# Patient Record
Sex: Male | Born: 2000 | Race: White | Hispanic: Yes | Smoking: Never smoker
Health system: Southern US, Community
[De-identification: ages and names within clinical notes are randomized; demographics above are authoritative.]

## PROBLEM LIST (undated history)

## (undated) DIAGNOSIS — F419 Anxiety disorder, unspecified: Secondary | ICD-10-CM

## (undated) DIAGNOSIS — Z973 Presence of spectacles and contact lenses: Secondary | ICD-10-CM

## (undated) DIAGNOSIS — R32 Unspecified urinary incontinence: Secondary | ICD-10-CM

## (undated) DIAGNOSIS — J45909 Unspecified asthma, uncomplicated: Secondary | ICD-10-CM

## (undated) HISTORY — DX: Anxiety disorder, unspecified: F41.9

## (undated) HISTORY — PX: ADENOIDECTOMY: SUR15

## (undated) HISTORY — DX: Presence of spectacles and contact lenses: Z97.3

## (undated) HISTORY — DX: Unspecified urinary incontinence: R32

## (undated) HISTORY — DX: Unspecified asthma, uncomplicated: J45.909

---
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
## 2020-07-12 DIAGNOSIS — K219 Gastro-esophageal reflux disease without esophagitis: Secondary | ICD-10-CM | POA: Diagnosis not present

## 2020-09-08 ENCOUNTER — Other Ambulatory Visit: Payer: Self-pay

## 2020-09-08 ENCOUNTER — Encounter: Payer: Self-pay | Admitting: Medical

## 2020-09-08 ENCOUNTER — Ambulatory Visit: Payer: 59 | Admitting: Medical

## 2020-09-08 VITALS — BP 124/82 | HR 60 | Temp 98.2°F | Wt 259.2 lb

## 2020-09-08 DIAGNOSIS — Z7185 Encounter for immunization safety counseling: Secondary | ICD-10-CM

## 2020-09-08 DIAGNOSIS — Z13220 Encounter for screening for lipoid disorders: Secondary | ICD-10-CM

## 2020-09-08 DIAGNOSIS — R002 Palpitations: Secondary | ICD-10-CM | POA: Diagnosis not present

## 2020-09-08 DIAGNOSIS — Z0000 Encounter for general adult medical examination without abnormal findings: Secondary | ICD-10-CM

## 2020-09-08 DIAGNOSIS — R42 Dizziness and giddiness: Secondary | ICD-10-CM | POA: Insufficient documentation

## 2020-09-08 DIAGNOSIS — Z131 Encounter for screening for diabetes mellitus: Secondary | ICD-10-CM

## 2020-09-08 DIAGNOSIS — Z113 Encounter for screening for infections with a predominantly sexual mode of transmission: Secondary | ICD-10-CM

## 2020-09-08 DIAGNOSIS — R109 Unspecified abdominal pain: Secondary | ICD-10-CM | POA: Insufficient documentation

## 2020-09-08 LAB — LIPID PANEL

## 2020-09-08 LAB — CBC WITH DIFFERENTIAL/PLATELET
Eos: 10 %
Immature Granulocytes: 0 %
MCHC: 33.1 g/dL (ref 31.5–35.7)
MCV: 88 fL (ref 79–97)
RDW: 11.9 % (ref 11.6–15.4)

## 2020-09-08 LAB — COMPREHENSIVE METABOLIC PANEL

## 2020-09-08 LAB — RPR

## 2020-09-08 LAB — HIV ANTIBODY (ROUTINE TESTING W REFLEX)

## 2020-09-08 NOTE — Progress Notes (Signed)
Subjective:   HPI  Ricky Morgan is a 19 y.o. male who presents for Chief Complaint  Patient presents with  . other    new pt. acute BP issues uncomfortable when standing at work head feels heavy gets light headed after sitting sometimes and getting up to fast.     Patient Care Team: Andrus Sharp, Cleda Mccreedy as PCP - General (Family Medicine) Sees dentist Sees eye doctor  Concerns: Here as a new patient.   Works for [employer]. Feels off.  When standing for long periods talking with a customer, feels heavy or unusual standing in one place for a period of time.  Back of legs can get achy.  Sometimes feels lightheaded.  Particularly if standing fast or moving suddenly feels lightheaded.   Sometimes particularly in summer would feel dehydrated.   Sometimes can get a little urinary leakage, particular wit urinary frequency.    Nonsmoker.  Drinks maybe once a month.    Has had some anxiety issues.  Earlier in the [place], was nervous on the plane.  Felt uneasy in a group of people when out of state earlier this year.     When eating pasta, feels awful aft wards.   He has recently cut back on this which has  Been helpful.  Been using kambucha  Was 290lb end of 2020 but since working 2 jobs earlier this year, lost some weight.  Heaviest weight was around 300lb.    Reviewed their medical, surgical, family, social, medication, and allergy history and updated chart as appropriate.  Past Medical History:  Diagnosis Date  . Anxiety   . Urinary incontinence    summer 08/2020  . Wears glasses     Past Surgical History:  Procedure Laterality Date  . ADENOIDECTOMY      Family History  Problem Relation Age of Onset  . Hypertension Father   . Hyperlipidemia Father   . Diabetes Father        prediabetes  . Cancer Neg Hx   . Heart disease Neg Hx     No current outpatient medications on file.  Not on File     Review of Systems Constitutional: -fever, -chills,  -sweats, -unexpected weight change, -decreased appetite, -fatigue Allergy: -sneezing, -itching, -congestion Dermatology: -changing moles, --rash, -lumps ENT: -runny nose, -ear pain, -sore throat, -hoarseness, -sinus pain, -teeth pain, - ringing in ears, -hearing loss, -nosebleeds Cardiology: -chest pain, +palpitations, -swelling, -difficulty breathing when lying flat, -waking up short of breath Respiratory: -cough, -shortness of breath, -difficulty breathing with exercise or exertion, -wheezing, -coughing up blood Gastroenterology: +abdominal pain, -nausea, -vomiting, -diarrhea, -constipation, -blood in stool, -changes in bowel movement, -difficulty swallowing or eating Hematology: -bleeding, -bruising  Musculoskeletal: -joint aches, -muscle aches, -joint swelling, -back pain, -neck pain, -cramping, -changes in gait Ophthalmology: denies vision changes, eye redness, itching, discharge Urology: -burning with urination, -difficulty urinating, -blood in urine, -urinary frequency, -urgency, -incontinence Neurology: -headache, -weakness, -tingling, -numbness, -memory loss, -falls, +dizziness Psychology: -depressed mood, -agitation, -sleep problems Male GU: no testicular mass, pain, no lymph nodes swollen, no swelling, no rash.     Objective:  BP 124/82   Pulse 60   Temp 98.2 F (36.8 C)   Wt 259 lb 3.2 oz (117.6 kg)   General appearance: alert, no distress, WD/WN, Caucasian male Skin: unremarkable HEENT: normocephalic, conjunctiva/corneas normal, sclerae anicteric, PERRLA, EOMi, nares patent, no discharge or erythema, pharynx normal Oral cavity: MMM, tongue normal, teeth normal Neck: carotid pulsation noted in right  neck, supple, no lymphadenopathy, no thyromegaly, no masses, normal ROM, no bruits Chest: non tender, normal shape and expansion Heart: RRR, normal S1, S2, no murmurs Lungs: CTA bilaterally, no wheezes, rhonchi, or rales Abdomen: +bs, soft, non tender, non distended, no masses,  no hepatomegaly, no splenomegaly, no bruits Back: non tender, normal ROM, no scoliosis Musculoskeletal: upper extremities non tender, no obvious deformity, normal ROM throughout, lower extremities non tender, no obvious deformity, normal ROM throughout Extremities: no edema, no cyanosis, no clubbing Pulses: 2+ symmetric, upper and lower extremities, normal cap refill Neurological: alert, oriented x 3, CN2-12 intact, strength normal upper extremities and lower extremities, sensation normal throughout, DTRs 2+ throughout, no cerebellar signs, gait normal Psychiatric: normal affect, behavior normal, pleasant  GU: normal male external genitalia,uncircumcised, nontender, no masses, no hernia, no lymphadenopathy Rectal: deferred   Assessment and Plan :   Encounter Diagnoses  Name Primary?  . Encounter for health maintenance examination in adult Yes  . Lightheaded   . Dizziness   . Vaccine counseling   . Screen for STD (sexually transmitted disease)   . Palpitation   . Abdominal discomfort   . Screening for lipid disorders   . Screening for diabetes mellitus     Physical exam - discussed and counseled on healthy lifestyle, diet, exercise, preventative care, vaccinations, sick and well care, proper use of emergency dept and after hours care, and addressed their concerns.    Health screening: See your eye doctor yearly for routine vision care. See your dentist yearly for routine dental care including hygiene visits twice yearly.  Discussed STD testing, discussed prevention, condom use, means of transmission  Cancer screening Advised monthly self testicular exam  Vaccinations: Advised yearly influenza vaccine Declines vaccines today   Separate significant issues discussed: We discussed his concerns.  It is not clear if this is a psychological issue versus something medical.  We will do screening today to help rule out some things.  He has lost significant weight since a year ago.  He  was close to 300 pounds a year ago.  A lot of his symptoms in the summertime could have been dehydration related.  Screenings today, labs as below.  He may have some gluten issues given that he has belly issues eating pasta and similar foods.  I advised if no lab abnormalities and if the EKG is normal then there could be some component of anxiety.  Counseled on good water intake, minimizing salt and caffeine.  Amado was seen today for other.  Diagnoses and all orders for this visit:  Encounter for health maintenance examination in adult -     EKG 12-Lead -     Comprehensive metabolic panel -     CBC with Differential/Platelet -     TSH -     Lipid panel -     HIV Antibody (routine testing w rflx) -     RPR -     GC/Chlamydia Probe Amp -     Hemoglobin A1c  Lightheaded -     EKG 12-Lead  Dizziness -     EKG 12-Lead  Vaccine counseling  Screen for STD (sexually transmitted disease) -     HIV Antibody (routine testing w rflx) -     RPR -     GC/Chlamydia Probe Amp  Palpitation -     EKG 12-Lead  Abdominal discomfort -     Lipid panel  Screening for lipid disorders  Screening for diabetes mellitus -  Hemoglobin A1c    Follow-up pending labs, yearly for physical

## 2020-09-09 LAB — TSH: TSH: 1.66 u[IU]/mL (ref 0.450–4.500)

## 2020-09-09 LAB — COMPREHENSIVE METABOLIC PANEL
ALT: 23 IU/L (ref 0–44)
AST: 21 IU/L (ref 0–40)
Albumin/Globulin Ratio: 1.8 (ref 1.2–2.2)
Albumin: 4.8 g/dL (ref 4.1–5.2)
Alkaline Phosphatase: 79 IU/L (ref 44–121)
BUN: 12 mg/dL (ref 6–20)
Bilirubin Total: 0.3 mg/dL (ref 0.0–1.2)
CO2: 22 mmol/L (ref 20–29)
Calcium: 9.5 mg/dL (ref 8.7–10.2)
GFR calc Af Amer: 135 mL/min/{1.73_m2} (ref >59)
GFR calc non Af Amer: 117 mL/min/{1.73_m2} (ref >59)
Globulin, Total: 2.7 g/dL (ref 1.5–4.5)
Glucose: 88 mg/dL (ref 65–99)
Potassium: 4.4 mmol/L (ref 3.5–5.2)
Total Protein: 7.5 g/dL (ref 6.0–8.5)

## 2020-09-09 LAB — LIPID PANEL
Cholesterol, Total: 177 mg/dL (ref 100–199)
HDL: 51 mg/dL (ref >39)
VLDL Cholesterol Cal: 29 mg/dL (ref 5–40)

## 2020-09-09 LAB — CBC WITH DIFFERENTIAL/PLATELET
Basophils Absolute: 0 10*3/uL (ref 0.0–0.2)
Basos: 1 %
EOS (ABSOLUTE): 0.6 10*3/uL — ABNORMAL HIGH (ref 0.0–0.4)
Hematocrit: 45 % (ref 37.5–51.0)
Hemoglobin: 14.9 g/dL (ref 13.0–17.7)
Immature Grans (Abs): 0 10*3/uL (ref 0.0–0.1)
Lymphocytes Absolute: 1.9 10*3/uL (ref 0.7–3.1)
Lymphs: 34 %
MCH: 29.1 pg (ref 26.6–33.0)
Monocytes Absolute: 0.4 10*3/uL (ref 0.1–0.9)
Monocytes: 8 %
Neutrophils Absolute: 2.7 10*3/uL (ref 1.4–7.0)
Neutrophils: 47 %
Platelets: 313 10*3/uL (ref 150–450)
RBC: 5.12 x10E6/uL (ref 4.14–5.80)
WBC: 5.7 10*3/uL (ref 3.4–10.8)

## 2020-09-09 LAB — HEMOGLOBIN A1C
Est. average glucose Bld gHb Est-mCnc: 105 mg/dL
Hgb A1c MFr Bld: 5.3 % (ref 4.8–5.6)

## 2020-09-10 LAB — GC/CHLAMYDIA PROBE AMP
Chlamydia trachomatis, NAA: NEGATIVE
Neisseria Gonorrhoeae by PCR: NEGATIVE

## 2020-10-27 ENCOUNTER — Other Ambulatory Visit (INDEPENDENT_AMBULATORY_CARE_PROVIDER_SITE_OTHER): Payer: 59

## 2020-10-27 ENCOUNTER — Encounter: Payer: Self-pay | Admitting: Medical

## 2020-10-27 ENCOUNTER — Other Ambulatory Visit: Payer: Self-pay

## 2020-10-27 ENCOUNTER — Telehealth: Payer: 59 | Admitting: Medical

## 2020-10-27 VITALS — Temp 97.4°F | Ht 74.0 in | Wt 260.0 lb

## 2020-10-27 DIAGNOSIS — Z20822 Contact with and (suspected) exposure to covid-19: Secondary | ICD-10-CM

## 2020-10-27 DIAGNOSIS — R0981 Nasal congestion: Secondary | ICD-10-CM

## 2020-10-27 DIAGNOSIS — R109 Unspecified abdominal pain: Secondary | ICD-10-CM | POA: Diagnosis not present

## 2020-10-27 DIAGNOSIS — R140 Abdominal distension (gaseous): Secondary | ICD-10-CM

## 2020-10-27 DIAGNOSIS — J029 Acute pharyngitis, unspecified: Secondary | ICD-10-CM

## 2020-10-27 DIAGNOSIS — K5900 Constipation, unspecified: Secondary | ICD-10-CM

## 2020-10-27 LAB — POC COVID19 BINAXNOW: SARS Coronavirus 2 Ag: NEGATIVE

## 2020-10-27 MED ORDER — OMEPRAZOLE 40 MG PO CPDR: 40.0000 mg | DELAYED_RELEASE_CAPSULE | Freq: Every day | ORAL | 0 refills | Status: DC

## 2020-10-27 MED ORDER — POLYETHYLENE GLYCOL 3350 17 GM/SCOOP PO POWD: 17.0000 g | Freq: Every day | ORAL | 0 refills | Status: DC

## 2020-10-27 NOTE — Progress Notes (Signed)
Subjective:     Patient ID: Ricky Morgan, male   DOB: June 07, 2001, 20 y.o.   MRN: [id number]  This visit type was conducted due to national recommendations for restrictions regarding the COVID-19 Pandemic (e.g. social distancing) in an effort to limit this patient's exposure and mitigate transmission in our community.  Due to their co-morbid illnesses, this patient is at least at moderate risk for complications without adequate follow up.  This format is felt to be most appropriate for this patient at this time.    Documentation for virtual audio and video telecommunications through [place] encounter:  The patient was located at home. The provider was located in the office. The patient did consent to this visit and is aware of possible charges through their insurance for this visit.  The other persons participating in this telemedicine service were none. Time spent on call was 20 minutes and in review of previous records 20 minutes total.  This virtual service is not related to other E/M service within previous 7 days.   HPI Chief Complaint  Patient presents with  . Covid Exposure    Gf has covid-scratchy throat started last Friday and is getting better   Ricky Morgan for exposure.     Has had about a week of symptoms including headache, sore throat, some fatigue last weekend, but otherwise fine.   Most symptoms have resolved, so now just a lingering scratchy throat.     currently no fever, no signifnicant cough, no vomiting, no head congestion.   He has not been tested.  He thought he just had a cold.     His girlfriend has a little congestion and sore throat, got tested and showed up positive for covid yesterday.   He and girlfriend lives together and works at the same place.  He has not had the covid vaccine.    He notes second issue.   Gets bloated after eating certain foods.  lately getting some constipation.  Trying to cut out carbs.  Sometimes gets random abdominal pains  . Sometimes upper right, sometimes all over.   Sometimes gets pain within 30 minutes of eating.  Has tried some digestive enzymes that helps some times.  Heavier foods such as cheese and dairy seems to make things worse.  At times belching.  Feels like a lump in throat after eating.  Having BM about once daily, but lately small harder stool.  No blood in stool.  No other aggravating or relieving factors. No other complaint.   Past Medical History:  Diagnosis Date  . Anxiety   . Urinary incontinence    summer 08/2020  . Wears glasses    No current outpatient medications on file prior to visit.   No current facility-administered medications on file prior to visit.     Review of Systems As in subjective    Objective:   Physical Exam Due to coronavirus pandemic stay at home measures, patient visit was virtual and they were not examined in person.   Temp (!) 97.4 F (36.3 C)   Ht 6\' 2"  (1.88 m)   Wt 260 lb (117.9 kg)   BMI 33.38 kg/m   Gen: wd, wn, nad No obvious wheezing or dyspnea Well appearing      Assessment:     Encounter Diagnoses  Name Primary?  . Sore throat Yes  . Close exposure to COVID-19 virus   . Head congestion   . Abdominal pain, unspecified abdominal location   . Abdominal bloating   .  Constipation, unspecified constipation type        Plan:     COVID exposure, a week of symptoms last week with mild cold symptoms that is mostly resolved.  He will come in today for COVID testing for work purposes.  If negative he states that his work will require him to come back in next week on January 11 for a repeat COVID test to make sure he is negative.  Either way he wants to come in today for screening  We discussed supportive care, rest, hydrate well, Tylenol as needed for pain or aches or fever, over-the-counter cold and flu medicine if needed but his symptoms have mostly resolved  We discussed quarantine measures  Abdominal pain, constipation, bloating-we  discussed differential which could include indigestion, reflux, constipation or even gallbladder issue.  Advised he avoid acidic and spicy foods, avoid fatty foods and fried foods and big portions.  Begin medication as below MiraLAX and omeprazole.  If much improved within 2 weeks then let me know.  If not improving or if worse right upper quadrant pain come in person for evaluation for exam and possible imaging such as KUB and/or ultrasound of abdomen  Ricky Morgan was seen today for covid exposure.  Diagnoses and all orders for this visit:  Sore throat -     POC COVID-19 BinaxNow; Future -     Novel Coronavirus, NAA ([facility]); Future  Close exposure to COVID-19 virus -     POC COVID-19 BinaxNow; Future -     Novel Coronavirus, NAA ([facility]); Future  Head congestion -     POC COVID-19 BinaxNow; Future -     Novel Coronavirus, NAA ([facility]); Future  Abdominal pain, unspecified abdominal location  Abdominal bloating  Constipation, unspecified constipation type  Other orders -     omeprazole (PRILOSEC) 40 MG capsule; Take 1 capsule (40 mg total) by mouth daily. -     polyethylene glycol powder (GLYCOLAX/MIRALAX) 17 GM/SCOOP powder; Take 17 g by mouth daily.  f/u today for covid testing

## 2020-10-30 ENCOUNTER — Telehealth: Payer: Self-pay

## 2020-10-30 NOTE — Telephone Encounter (Signed)
Pt. Aware I will put in the order tomorrow once checked in.

## 2020-10-30 NOTE — Telephone Encounter (Signed)
Pt. Called stating that you told him last week when he had his virtual apt with you he could come back Tuesday for a rapid covid test only to return to work. I scheduled him for tomorrow morning at 10:30 a.m. is this ok.

## 2020-10-30 NOTE — Telephone Encounter (Signed)
Yes that is fine for him to come back in for the rapid.  Could you please put in the order?  Also check with Byrd Hesselbach because the PCR test is not back yet from 10/27/2020

## 2020-10-31 ENCOUNTER — Other Ambulatory Visit: Payer: Self-pay

## 2020-10-31 ENCOUNTER — Other Ambulatory Visit (INDEPENDENT_AMBULATORY_CARE_PROVIDER_SITE_OTHER): Payer: 59

## 2020-10-31 DIAGNOSIS — R059 Cough, unspecified: Secondary | ICD-10-CM | POA: Diagnosis not present

## 2020-10-31 LAB — NOVEL CORONAVIRUS, NAA: SARS-CoV-2, NAA: NOT DETECTED

## 2020-10-31 LAB — POC COVID19 BINAXNOW: SARS Coronavirus 2 Ag: NEGATIVE

## 2020-11-19 ENCOUNTER — Other Ambulatory Visit: Payer: Self-pay | Admitting: Medical

## 2020-11-29 ENCOUNTER — Other Ambulatory Visit: Payer: Self-pay

## 2020-11-29 ENCOUNTER — Ambulatory Visit: Payer: 59 | Admitting: Medical

## 2020-11-29 ENCOUNTER — Encounter: Payer: Self-pay | Admitting: Medical

## 2020-11-29 VITALS — BP 136/70 | HR 71 | Temp 98.2°F | Ht 74.0 in | Wt 262.8 lb

## 2020-11-29 DIAGNOSIS — K5900 Constipation, unspecified: Secondary | ICD-10-CM | POA: Diagnosis not present

## 2020-11-29 DIAGNOSIS — R1011 Right upper quadrant pain: Secondary | ICD-10-CM | POA: Insufficient documentation

## 2020-11-29 DIAGNOSIS — K219 Gastro-esophageal reflux disease without esophagitis: Secondary | ICD-10-CM

## 2020-11-29 DIAGNOSIS — R0789 Other chest pain: Secondary | ICD-10-CM

## 2020-11-29 MED ORDER — DOCUSATE SODIUM 100 MG PO CAPS: 100.0000 mg | ORAL_CAPSULE | Freq: Two times a day (BID) | ORAL | 0 refills | Status: DC

## 2020-11-29 NOTE — Progress Notes (Signed)
Done

## 2020-11-29 NOTE — Progress Notes (Signed)
Subjective:  Ricky Morgan is a 20 y.o. male who presents for Chief Complaint  Patient presents with  . Flank Pain    Right side pain x4 days      Here for pains.  Just got back from a trip.  Went to [employer] recently with some friends.   Did some amusement rides.  Did the alpine coaster.  Ate some unhealthy foods while there.  Drank a fair amount of alcohol while there.  No specific trauma or injury.  No obvious bruising.  Has some aching pain in right upper abdomen area.  Has subsided a little but still an annoying pain.  At times he may get some pain after eating but not every time.  No nausea no vomiting.  Lately he has been a little constipated at times.  He still can be a little harder and may not go necessarily every day.  The color even looks different lately.   Did some milk of magnesium recently.  It did help   Last visit virtual he also had ongoing RUQ pains.  Past Medical History:  Diagnosis Date  . Anxiety   . Urinary incontinence    summer 08/2020  . Wears glasses    No other aggravating or relieving factors. No other complaint.   The following portions of the patient's history were reviewed and updated as appropriate: allergies, current medications, past family history, past medical history, past social history, past surgical history and problem list.  ROS Otherwise as in subjective above  Objective: BP 136/70   Pulse 71   Temp 98.2 F (36.8 C)   Ht 6\' 2"  (1.88 m)   Wt 262 lb 12.8 oz (119.2 kg)   SpO2 98%   BMI 33.74 kg/m   General appearance: alert, no distress, well developed, well nourished Heart: RRR, normal S1, S2, no murmurs Lungs: CTA bilaterally, no wheezes, rhonchi, or rales Tender right anterior lower chest wall over the ribs but no bruising, otherwise chest nontender, normal inspiration expiration Abdomen: +bs, soft, mild right upper quadrant tenderness, non tender, non distended, no masses, no hepatomegaly, no splenomegaly Pulses: 2+ radial  pulses, 2+ pedal pulses, normal cap refill Ext: no edema   Assessment: Encounter Diagnoses  Name Primary?  . Chest wall pain Yes  . RUQ abdominal pain   . Gastroesophageal reflux disease, unspecified whether esophagitis present   . Constipation, unspecified constipation type      Plan: We discussed his recent concerns.  He does seem to have some chest wall tenderness likely from being bumped around on the [place] this past weekend.  Advise short-term relative rest, avoid re injury, can use cool pack or ice pack 20 minutes on 20 minutes off, over-the-counter Tylenol or ibuprofen for the next several days.  This should gradually resolve  Right upper quadrant pain-ongoing for at least the last few months.  Possibility of gallbladder issue discussed.  We will schedule for ultrasound.  Avoid acidic foods, spicy foods, fatty foods, large portions  Constipation-begin Colace as needed, discussed good hydration, fiber intake  GERD-continue PPI for now  Ricky Morgan was seen today for flank pain.  Diagnoses and all orders for this visit:  Chest wall pain  RUQ abdominal pain -     Ricky Morgan Abdomen Complete; Future  Gastroesophageal reflux disease, unspecified whether esophagitis present  Constipation, unspecified constipation type  Other orders -     docusate sodium (COLACE) 100 MG capsule; Take 1 capsule (100 mg total) by mouth 2 (two)  times daily.    Follow up: Pending ultrasound

## 2020-12-13 ENCOUNTER — Other Ambulatory Visit: Payer: Self-pay

## 2020-12-21 ENCOUNTER — Other Ambulatory Visit: Payer: Self-pay | Admitting: Medical

## 2020-12-26 ENCOUNTER — Ambulatory Visit
Admission: RE | Admit: 2020-12-26 | Discharge: 2020-12-26 | Disposition: A | Discharge status: Home / Self Care | Payer: Managed Care, Other (non HMO) | Source: Ambulatory Visit | Attending: Medical | Admitting: Medical

## 2020-12-26 DIAGNOSIS — R1011 Right upper quadrant pain: Secondary | ICD-10-CM

## 2021-01-01 ENCOUNTER — Other Ambulatory Visit: Payer: Self-pay

## 2021-01-01 DIAGNOSIS — R1011 Right upper quadrant pain: Secondary | ICD-10-CM

## 2021-01-01 DIAGNOSIS — K5900 Constipation, unspecified: Secondary | ICD-10-CM

## 2021-01-01 DIAGNOSIS — K219 Gastro-esophageal reflux disease without esophagitis: Secondary | ICD-10-CM

## 2021-01-06 ENCOUNTER — Other Ambulatory Visit: Payer: Self-pay | Admitting: Medical

## 2021-03-05 ENCOUNTER — Encounter: Payer: Self-pay | Admitting: Nurse Practitioner

## 2021-03-06 ENCOUNTER — Encounter: Payer: Self-pay | Admitting: Nurse Practitioner

## 2021-03-06 ENCOUNTER — Telehealth: Payer: Managed Care, Other (non HMO) | Admitting: Family

## 2021-03-06 DIAGNOSIS — R109 Unspecified abdominal pain: Secondary | ICD-10-CM

## 2021-03-07 NOTE — Progress Notes (Signed)
Based on what you shared with me, I feel your condition warrants further evaluation and I recommend that you be seen in a face to face office visit.  Given your abdominal pain, you need to be seen in person to be evaluated.    NOTE: If you entered your credit card information for this eVisit, you will not be charged. You may see a "hold" on your card for the $35 but that hold will drop off and you will not have a charge processed.   If you are having a true medical emergency please call 911.      For an urgent face to face visit,  has six urgent care centers for your convenience:     [facility] at [facility] Directions [phone] [address] [place], [place] [address] . 8 am - 4 pm Monday - Friday    [facility] Get Driving Directions [phone] [address] [place], [place] [address] . 8 am to 8 pm Monday-Friday . 10 am to 6 pm [facility] Urgent [facility]) Get Driving Directions [phone]  [address] [place],  [place]  [address] . 8 am to 8 pm Monday-Friday . 8 am to 4 pm [facility] at [facility] Get Driving Directions [phone] [address] [place], [place] [address] . 8 am to 8 pm Monday-Friday . 8 am to 4 pm [facility] at [facility] Get Driving Directions  [phone] [address] [place], [place] [address] . 8 am to 8 pm Monday-Friday . 8 am to 4 pm [facility] at [facility] Directions [phone] [address], [address] [place], [place] [address] . 8 am to 8 pm Monday-Friday . 8 am to 4 pm Saturday-Sunday     Your MyChart E-visit questionnaire answers were reviewed by a board certified advanced clinical practitioner to complete your personal care  plan based on your specific symptoms.  Thank you for using e-Visits.

## 2021-03-28 ENCOUNTER — Encounter: Payer: Self-pay | Admitting: Nurse Practitioner

## 2021-03-28 ENCOUNTER — Ambulatory Visit: Payer: Managed Care, Other (non HMO) | Admitting: Nurse Practitioner

## 2021-03-28 ENCOUNTER — Other Ambulatory Visit (INDEPENDENT_AMBULATORY_CARE_PROVIDER_SITE_OTHER): Payer: Managed Care, Other (non HMO)

## 2021-03-28 VITALS — BP 120/76 | HR 64 | Ht 72.5 in | Wt 300.2 lb

## 2021-03-28 DIAGNOSIS — R1012 Left upper quadrant pain: Secondary | ICD-10-CM

## 2021-03-28 DIAGNOSIS — K5900 Constipation, unspecified: Secondary | ICD-10-CM

## 2021-03-28 DIAGNOSIS — R1011 Right upper quadrant pain: Secondary | ICD-10-CM

## 2021-03-28 DIAGNOSIS — K824 Cholesterolosis of gallbladder: Secondary | ICD-10-CM

## 2021-03-28 LAB — CBC WITH DIFFERENTIAL/PLATELET
Basophils Absolute: 0 10*3/uL (ref 0.0–0.1)
Basophils Relative: 0.3 % (ref 0.0–3.0)
Eosinophils Absolute: 0.2 10*3/uL (ref 0.0–0.7)
Eosinophils Relative: 4.8 % (ref 0.0–5.0)
HCT: 41.8 % (ref 39.0–52.0)
Hemoglobin: 14.3 g/dL (ref 13.0–17.0)
Lymphocytes Relative: 41.2 % (ref 12.0–46.0)
Lymphs Abs: 1.9 10*3/uL (ref 0.7–4.0)
MCHC: 34.2 g/dL (ref 30.0–36.0)
MCV: 86.1 fl (ref 78.0–100.0)
Monocytes Absolute: 0.4 10*3/uL (ref 0.1–1.0)
Monocytes Relative: 8.5 % (ref 3.0–12.0)
Neutro Abs: 2.1 10*3/uL (ref 1.4–7.7)
Neutrophils Relative %: 45.2 % (ref 43.0–77.0)
Platelets: 280 10*3/uL (ref 150.0–400.0)
RBC: 4.86 Mil/uL (ref 4.22–5.81)
RDW: 12.7 % (ref 11.5–15.5)
WBC: 4.6 10*3/uL (ref 4.0–10.5)

## 2021-03-28 LAB — COMPREHENSIVE METABOLIC PANEL
ALT: 25 U/L (ref 0–53)
AST: 27 U/L (ref 0–37)
Albumin: 4.5 g/dL (ref 3.5–5.2)
Alkaline Phosphatase: 58 U/L (ref 39–117)
BUN: 15 mg/dL (ref 6–23)
CO2: 25 mEq/L (ref 19–32)
Calcium: 9.6 mg/dL (ref 8.4–10.5)
Chloride: 104 mEq/L (ref 96–112)
Creatinine, Ser: 0.92 mg/dL (ref 0.40–1.50)
GFR: 118.21 mL/min (ref >60.00)
Glucose, Bld: 89 mg/dL (ref 70–99)
Potassium: 4.2 mEq/L (ref 3.5–5.1)
Sodium: 137 mEq/L (ref 135–145)
Total Bilirubin: 0.4 mg/dL (ref 0.2–1.2)
Total Protein: 7.7 g/dL (ref 6.0–8.3)

## 2021-03-28 NOTE — Progress Notes (Signed)
03/28/2021 Majid Mccravy [id number] 01/14/2001   CHIEF COMPLAINT: abdominal pain   HISTORY OF PRESENT ILLNESS:  Ricky Morgan is a 20 year old male with a past medical history of obesity, anxiety, allergies, asthma and constipation. Past tonsillectomy and adenoidectomy.  He was referred to our office by Crosby Oyster PA-C for further evaluation regarding GERD symptoms and constipation.  He complains of having intermittent right upper quadrant abdominal pain for the past 6 months and less frequent left upper quadrant abdominal pain.  He describes RUQ and LUQ pain as throbbing or squeezing type pain.  Eating food such as bread and Pasta worsen his abdominal pains.  He has mild nausea at times without vomiting.  He develops heartburn if he eats foods such as sausage or if he eats a larger meal.  No dysphagia.  He started taking a digestive enzyme daily 2 or 3 months ago and drinking kombucha and his upper abdominal pains have diminished.  He took Omeprazole for 1 week in March 2022 which resulted in feeling full with worsening constipation so he stopped taking it.  He typically passes a small brown formed bowel movement most days.  He does not feel emptied after defecation.  No rectal bleeding or melena.  He underwent a RUQ sonogram 12/26/2020 which showed 2 small gallbladder polyps without evidence of gallstones and the CBD was normal at 3 mm.  Possible mild fatty steatosis was noted.  He went on a keto diet for a few months and lost about 20 pounds then resumed a normal diet and gained back the lost weight.  He takes Aleve 2 tabs once monthly for generalized aches and pains.  He drinks 6-7 shots of alcohol once monthly.  No drug use.  His father had gallbladder surgery when he was 20 years old.  No known family history of celiac disease, IBD, esophageal or GI related cancer.   CBC Latest Ref Rng & Units 09/08/2020  WBC 3.4 - 10.8 x10E3/uL 5.7  Hemoglobin 13.0 - 17.7 g/dL 41.9  Hematocrit 62.2 - 51.0 %  45.0  Platelets 150 - 450 x10E3/uL 313   CMP Latest Ref Rng & Units 09/08/2020  Glucose 65 - 99 mg/dL 88  BUN 6 - 20 mg/dL 12  Creatinine 2.97 - 9.89 mg/dL 2.11  Sodium 941 - 740 mmol/L 138  Potassium 3.5 - 5.2 mmol/L 4.4  Chloride 96 - 106 mmol/L 103  CO2 20 - 29 mmol/L 22  Calcium 8.7 - 10.2 mg/dL 9.5  Total Protein 6.0 - 8.5 g/dL 7.5  Total Bilirubin 0.0 - 1.2 mg/dL 0.3  Alkaline Phos 44 - 121 IU/L 79  AST 0 - 40 IU/L 21  ALT 0 - 44 IU/L 23   RUQ sonogram 12/26/2020:  FINDINGS: Gallbladder: 2 small gallbladder polyps, the largest 6 mm. No stone disease. No wall thickening. No Murphy sign.  Common bile duct: Diameter: 3 mm common normal  Liver: Heterogeneous echotexture suggesting possible mild fatty change. No focal lesion. No ductal dilatation. Portal vein is patent on color Doppler imaging with normal direction of blood flow towards the liver.  IVC: No abnormality visualized.  Pancreas: Poorly seen because of overlying bowel gas.  Spleen: Size and appearance within normal limits.  Right Kidney: Length: 13.2 cm. Echogenicity within normal limits. No mass or hydronephrosis visualized.  Left Kidney: Length: 13.3 cm. Echogenicity within normal limits. No mass or hydronephrosis visualized.  Abdominal aorta: No aneurysm visualized.  Other findings: No ascites  IMPRESSION: Normal study  with exception of a slightly heterogeneous echotexture pattern of the liver parenchyma that could go along with mild fatty change. No gallbladder disease, focal liver parenchymal lesion or ductal dilatation.  Pancreas poorly seen because of overlying bowel gas.   Past Medical History:  Diagnosis Date  . Anxiety   . Urinary incontinence    summer 08/2020  . Wears glasses    Past Surgical History:  Procedure Laterality Date  . ADENOIDECTOMY     Social History: He is single.  Non-smoker.  He drinks 6-7 shots of liquor once monthly.  No drug use.  Family History:   Father with history of hypertension, hyperlipidemia and diabetes. Mother is healthy. No family history of esophageal, gastric or colon cancer.  No Known Allergies    Outpatient Encounter Medications as of 03/28/2021  Medication Sig  . docusate sodium (COLACE) 100 MG capsule Take 1 capsule (100 mg total) by mouth 2 (two) times daily.  Marland Kitchen omeprazole (PRILOSEC) 40 MG capsule TAKE 1 CAPSULE (40 MG TOTAL) BY MOUTH DAILY.  Marland Kitchen polyethylene glycol powder (GLYCOLAX/MIRALAX) 17 GM/SCOOP powder Take 17 g by mouth daily. (Patient not taking: Reported on 11/29/2020)   No facility-administered encounter medications on file as of 03/28/2021.    REVIEW OF SYSTEMS:  Gen: Denies fever, sweats or chills. No weight loss.  CV: Denies chest pain, palpitations or edema. Resp: Denies cough, shortness of breath of hemoptysis.  GI: Denies heartburn, dysphagia, stomach or lower abdominal pain. No diarrhea or constipation.  GU : + Urine leakage.  MS: + Back pain.  Derm: Denies rash, itchiness, skin lesions or unhealing ulcers. Psych: + Anxiety.  Heme: Denies bruising, bleeding. Neuro:  + Vision changes. Denies headaches, dizziness or paresthesias. Endo:  Denies any problems with DM, thyroid or adrenal function.  PHYSICAL EXAM: BP 120/76 (BP Location: Left Arm, Patient Position: Sitting, Cuff Size: Large)   Pulse 64   Ht 6' 0.5" (1.842 m) Comment: height measured without shoes  Wt (!) 300 lb 4 oz (136.2 kg)   BMI 40.16 kg/m   General: 20 year old male in no acute distress. Head: Normocephalic and atraumatic. Eyes:  Sclerae non-icteric, conjunctive pink. Ears: Normal auditory acuity. Mouth: Dentition intact. No ulcers or lesions.  Neck: Supple, no lymphadenopathy or thyromegaly.  Lungs: Clear bilaterally to auscultation without wheezes, crackles or rhonchi. Heart: Regular rate and rhythm. No murmur, rub or gallop appreciated.  Abdomen: Soft, nontender, non distended. No masses. No hepatosplenomegaly.  Normoactive bowel sounds x 4 quadrants.  Rectal: Deferred.  Musculoskeletal: Symmetrical with no gross deformities. Skin: Warm and dry. No rash or lesions on visible extremities. Extremities: No edema. Neurological: Alert oriented x 4, no focal deficits.  Psychological:  Alert and cooperative. Normal mood and affect.  ASSESSMENT AND PLAN:  67. 20 year old male with RUQ pain, less frequent LUQ pain and occasional heartburn which occurs if he eats fatty foods or larger meals. RUQ abd sono 12/2020 showed 2 small gallbladder polyps, no gallstones.  -GERD handout  -H. pylori stool antigen, TTG, IgA, CBC and CMP -Famotidine 20mg  otc one tab po QD, ok to start after completing H. Pylori stool antigen test  -Reduce carbohydrate intake, exercise as tolerated, weight loss recommended. Patient did not wish to purse consult with [facility] at this tilme -Avoid binge alcohol intake  2. Constipation  -Miralax Q HS  3. Gallbladder polyps per RUQ sono 12/2020 -Repeat abdominal sonogram in 1 year   4. Possible hepatic steatosis per  RUQ sono 12/2020. Normal LFTs. -Weight loss recommended   Patient to follow-up in the office in 6 weeks.  He will call our office if his symptoms worsen prior to his follow-up appointment date.  To consider EGD if his upper abdominal pains persist.    CC:  Tysinger, Kermit Balo, PA-C

## 2021-03-28 NOTE — Patient Instructions (Addendum)
LABS:  Lab work has been ordered for you today. Our lab is located in the basement. Press "B" on the elevator. The lab is located at the first door on the left as you exit the elevator.  HEALTHCARE LAWS AND MY CHART RESULTS: Due to recent changes in healthcare laws, you may see the results of your imaging and laboratory studies on MyChart before your provider has had a chance to review them.   We understand that in some cases there may be results that are confusing or concerning to you. Not all laboratory results come back in the same time frame and the provider may be waiting for multiple results in order to interpret others.  Please give [place] 48 hours in order for your provider to thoroughly review all the results before contacting the office for clarification of your results.   RECOMMENDATIONS: Reduce carbohydrate intake, exercise as tolerated. Miralax- Dissolve one capful in 8 ounces of water and drink before bed. May start Pepcid 20 MG once a day after you submit the stool sample. See GERD information. We have scheduled you a follow up with Dr. Adela Lank on 06/01/21 at 9:00am.  It was great seeing you today! Thank you for entrusting me with your care and choosing [facility].  Arnaldo Natal, CRNP  The [facility] providers would like to encourage you to use [facility] to communicate with providers for non-urgent requests or questions.  Due to long hold times on the telephone, sending your provider a message by [facility] may be faster and more efficient way to get a response. Please allow 48 business hours for a response.  Please remember that this is for non-urgent requests/questions.   Gastroesophageal Reflux Disease, Adult  Gastroesophageal reflux (GER) happens when acid from the stomach flows up into the tube that connects the mouth and the stomach (esophagus). Normally, food travels down the esophagus and stays in the stomach to be digested. With GER, food and stomach  acid sometimes move back up into the esophagus. You may have a disease called gastroesophageal reflux disease (GERD) if the reflux:  Happens often.  Causes frequent or very bad symptoms.  Causes problems such as damage to the esophagus. When this happens, the esophagus becomes sore and swollen. Over time, GERD can make small holes (ulcers) in the lining of the esophagus. What are the causes? This condition is caused by a problem with the muscle between the esophagus and the stomach. When this muscle is weak or not normal, it does not close properly to keep food and acid from coming back up from the stomach. The muscle can be weak because of:  Tobacco use.  Pregnancy.  Having a certain type of hernia (hiatal hernia).  Alcohol use.  Certain foods and drinks, such as coffee, chocolate, onions, and peppermint. What increases the risk?  Being overweight.  Having a disease that affects your connective tissue.  Taking NSAIDs, such a ibuprofen. What are the signs or symptoms?  Heartburn.  Difficult or painful swallowing.  The feeling of having a lump in the throat.  A bitter taste in the mouth.  Bad breath.  Having a lot of saliva.  Having an upset or bloated stomach.  Burping.  Chest pain. Different conditions can cause chest pain. Make sure you see your doctor if you have chest pain.  Shortness of breath or wheezing.  A long-term cough or a cough at night.  Wearing away of the surface of teeth (tooth enamel).  Weight loss.  How is this treated?  Making changes to your diet.  Taking medicine.  Having surgery. Treatment will depend on how bad your symptoms are. Follow these instructions at home: Eating and drinking  Follow a diet as told by your doctor. You may need to avoid foods and drinks such as: ? Coffee and tea, with or without caffeine. ? Drinks that contain alcohol. ? Energy drinks and sports drinks. ? Bubbly (carbonated) drinks or  sodas. ? Chocolate and cocoa. ? Peppermint and mint flavorings. ? Garlic and onions. ? Horseradish. ? Spicy and acidic foods. These include peppers, chili powder, curry powder, vinegar, hot sauces, and BBQ sauce. ? Citrus fruit juices and citrus fruits, such as oranges, lemons, and limes. ? Tomato-based foods. These include red sauce, chili, salsa, and pizza with red sauce. ? Fried and fatty foods. These include donuts, french fries, potato chips, and high-fat dressings. ? High-fat meats. These include hot dogs, rib eye steak, sausage, ham, and bacon. ? High-fat dairy items, such as whole milk, butter, and cream cheese.  Eat small meals often. Avoid eating large meals.  Avoid drinking large amounts of liquid with your meals.  Avoid eating meals during the 2-3 hours before bedtime.  Avoid lying down right after you eat.  Do not exercise right after you eat.   Lifestyle  Do not smoke or use any products that contain nicotine or tobacco. If you need help quitting, ask your doctor.  Try to lower your stress. If you need help doing this, ask your doctor.  If you are overweight, lose an amount of weight that is healthy for you. Ask your doctor about a safe weight loss goal.   General instructions  Pay attention to any changes in your symptoms.  Take over-the-counter and prescription medicines only as told by your doctor.  Do not take aspirin, ibuprofen, or other NSAIDs unless your doctor says it is okay.  Wear loose clothes. Do not wear anything tight around your waist.  Raise (elevate) the head of your bed about 6 inches (15 cm). You may need to use a wedge to do this.  Avoid bending over if this makes your symptoms worse.  Keep all follow-up visits. Contact a doctor if:  You have new symptoms.  You lose weight and you do not know why.  You have trouble swallowing or it hurts to swallow.  You have wheezing or a cough that keeps happening.  You have a hoarse  voice.  Your symptoms do not get better with treatment. Get help right away if:  You have sudden pain in your arms, neck, jaw, teeth, or back.  You suddenly feel sweaty, dizzy, or light-headed.  You have chest pain or shortness of breath.  You vomit and the vomit is green, yellow, or black, or it looks like blood or coffee grounds.  You faint.  Your poop (stool) is red, bloody, or black.  You cannot swallow, drink, or eat. These symptoms may represent a serious problem that is an emergency. Do not wait to see if the symptoms will go away. Get medical help right away. Call your local emergency services (911 in the [place] Do not drive yourself to the hospital. Summary  If a person has gastroesophageal reflux disease (GERD), food and stomach acid move back up into the esophagus and cause symptoms or problems such as damage to the esophagus.  Treatment will depend on how bad your symptoms are.  Follow a diet as told by your doctor.  Take all medicines only as told by your doctor. This information is not intended to replace advice given to you by your health care provider. Make sure you discuss any questions you have with your health care provider. Document Revised: 04/17/2020 Document Reviewed: 04/17/2020 Elsevier Patient Education  2021 [employer].

## 2021-03-29 LAB — TISSUE TRANSGLUTAMINASE ABS,IGG,IGA
(tTG) Ab, IgA: <1 U/mL
(tTG) Ab, IgG: <1 U/mL

## 2021-03-29 LAB — IGA: Immunoglobulin A: 225 mg/dL (ref 47–310)

## 2021-04-01 NOTE — Progress Notes (Signed)
Agree with assessment and plan as outlined.  

## 2021-04-30 IMAGING — US US ABDOMEN COMPLETE
1 series · 13 of 25 positions shown · non-contrast
Comparison: None.

CLINICAL DATA: Right upper quadrant abdominal pain over the last
year.

EXAM:
ABDOMEN ULTRASOUND COMPLETE

[Series 1: us abdomen complete · 0.23mm/px · 13 of 73 slices shown]
[im 1/73]
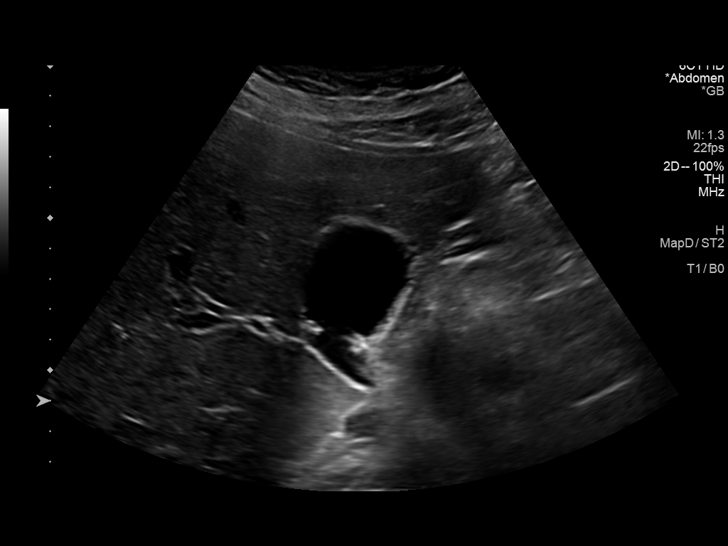
[im 7/73]
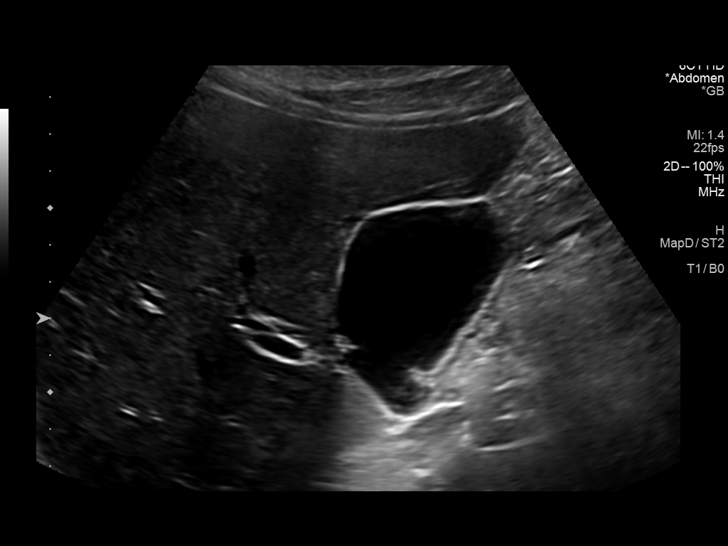
[im 13/73]
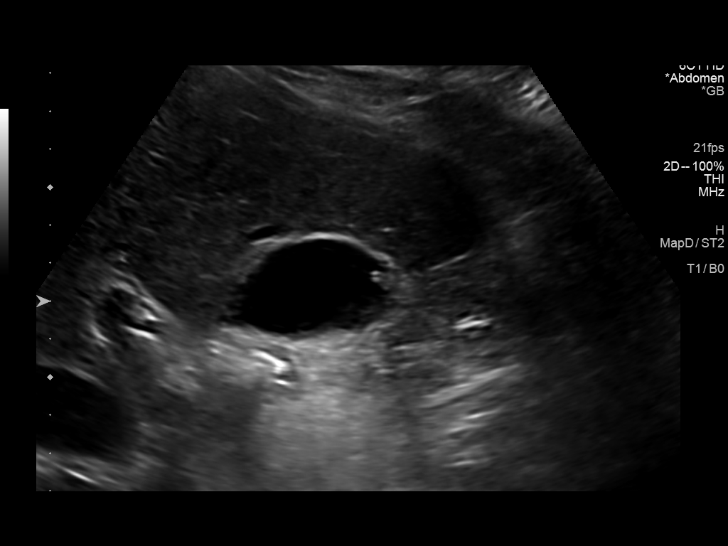
[im 19/73]
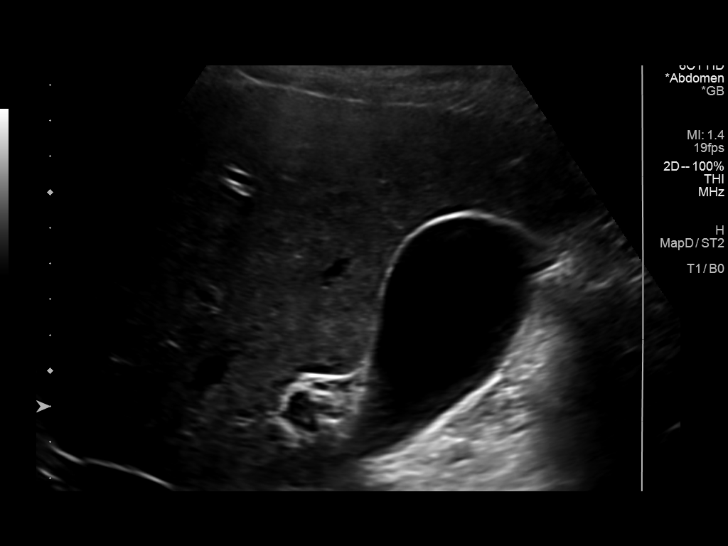
[im 25/73]
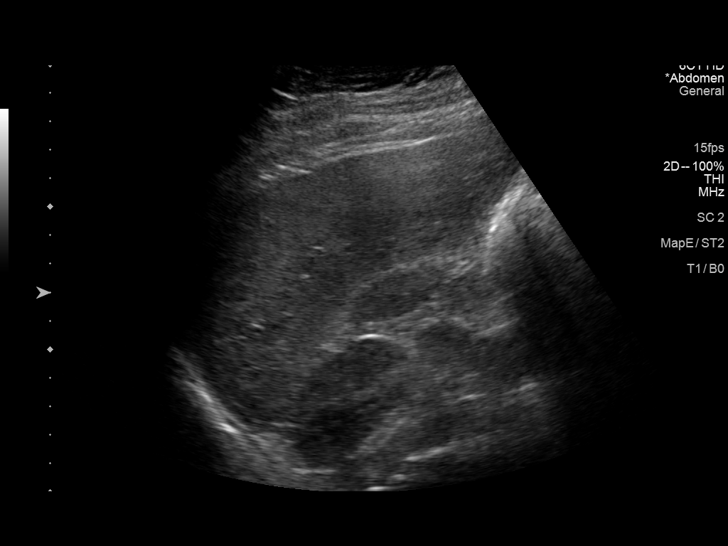
[im 31/73]
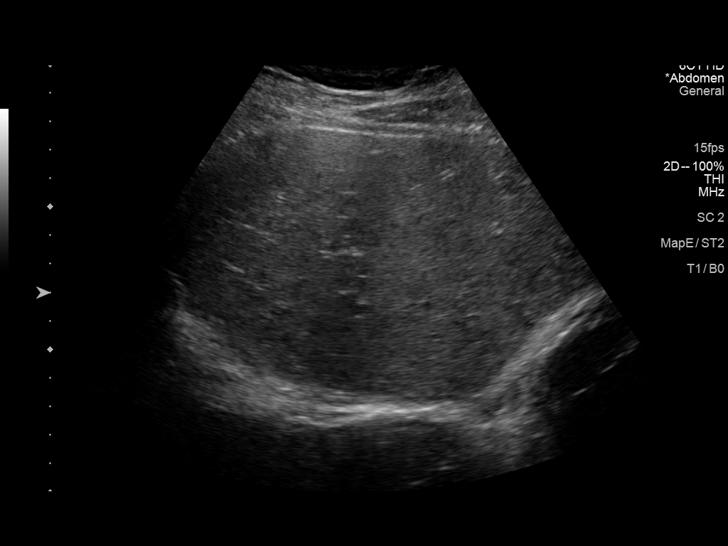
[im 37/73]
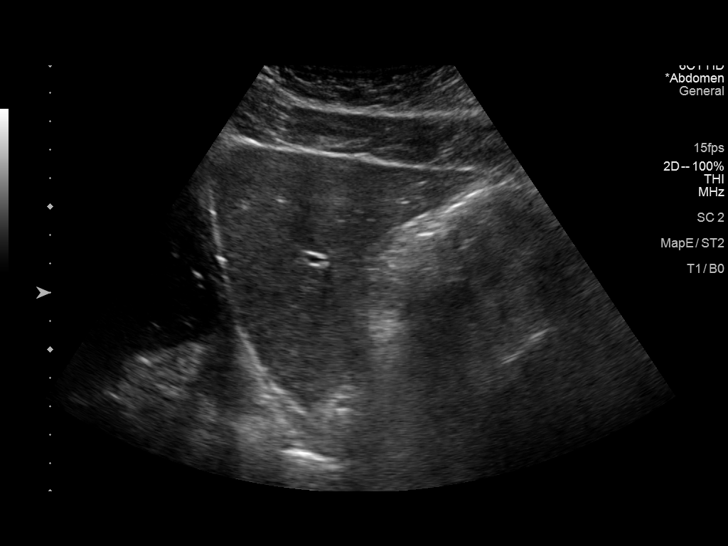
[im 43/73]
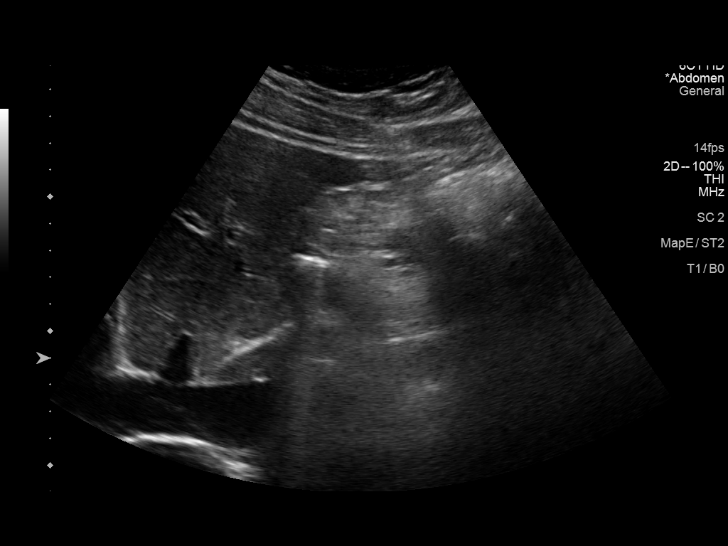
[im 49/73]
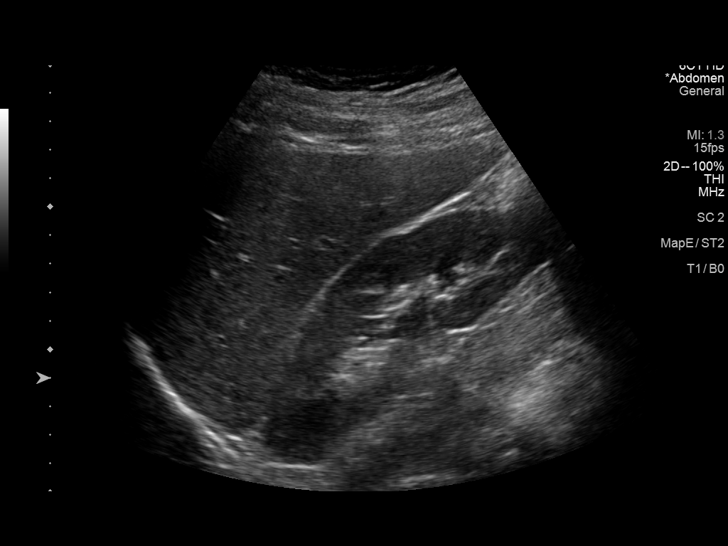
[im 55/73]
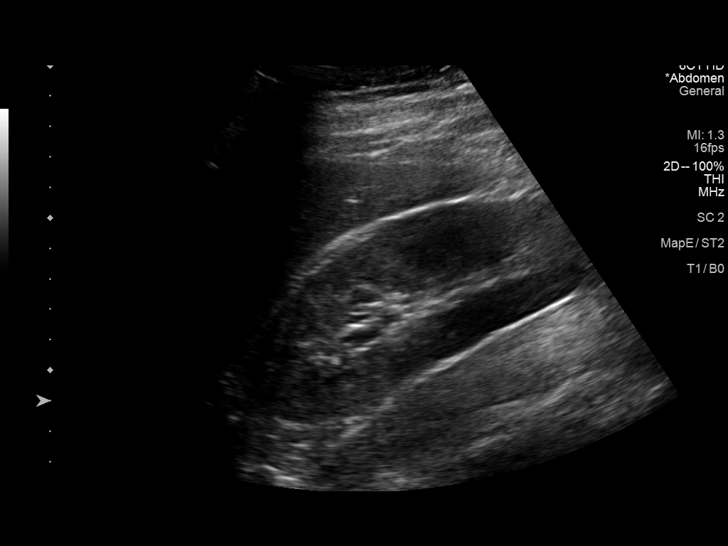
[im 61/73]
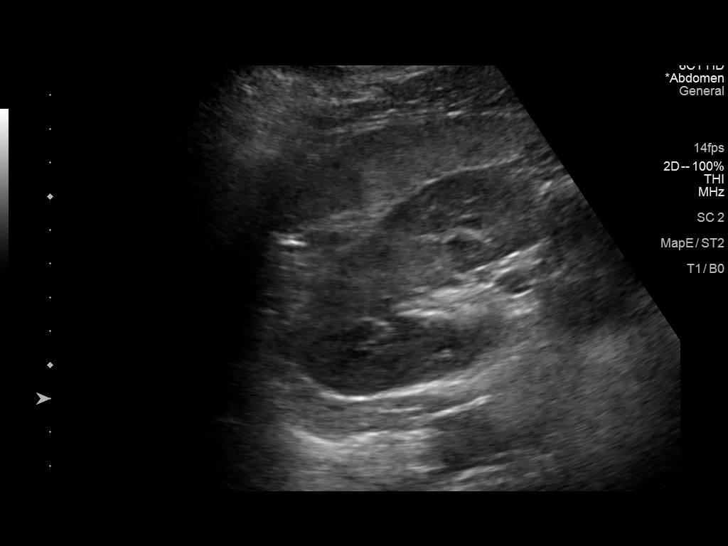
[im 67/73]
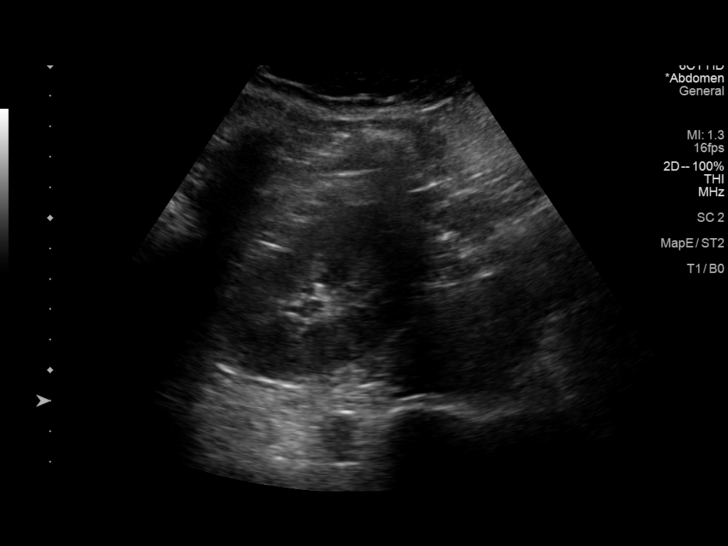
[im 73/73]
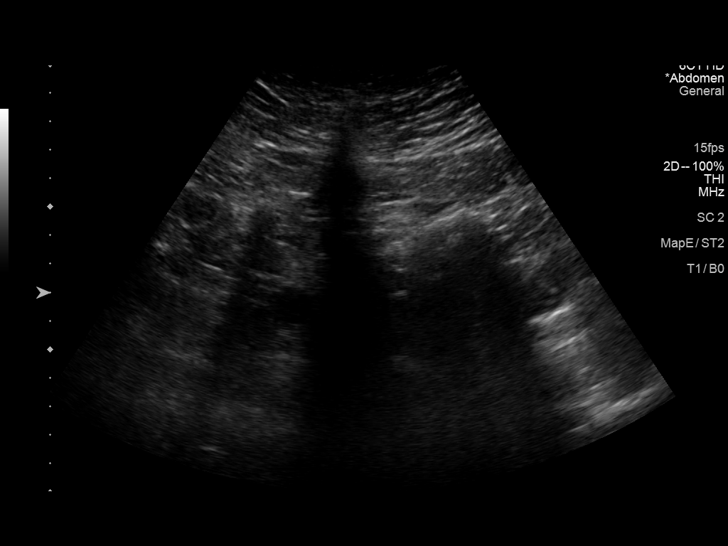

[13 of 25 positions shown; findings below may reference images not displayed]

FINDINGS: Gallbladder: 2 small gallbladder polyps, the largest 6 mm. No stone
disease. No wall thickening. No Murphy sign.

Common bile duct: Diameter: 3 mm common normal

Liver: Heterogeneous echotexture suggesting possible mild fatty
change. No focal lesion. No ductal dilatation. Portal vein is patent
on color Doppler imaging with normal direction of blood flow towards
the liver.

IVC: No abnormality visualized.

Pancreas: Poorly seen because of overlying bowel gas.

Spleen: Size and appearance within normal limits.

Right Kidney: Length: 13.2 cm. Echogenicity within normal limits. No
mass or hydronephrosis visualized.

Left Kidney: Length: 13.3 cm. Echogenicity within normal limits. No
mass or hydronephrosis visualized.

Abdominal aorta: No aneurysm visualized.

Other findings: No ascites
IMPRESSION: Normal study with exception of a slightly heterogeneous echotexture
pattern of the liver parenchyma that could go along with mild fatty
change. No gallbladder disease, focal liver parenchymal lesion or
ductal dilatation.

Pancreas poorly seen because of overlying bowel gas.

## 2021-05-01 ENCOUNTER — Telehealth: Payer: Managed Care, Other (non HMO) | Admitting: Family

## 2021-05-01 DIAGNOSIS — L255 Unspecified contact dermatitis due to plants, except food: Secondary | ICD-10-CM

## 2021-05-02 MED ORDER — PREDNISONE 10 MG PO TABS: ORAL_TABLET | ORAL | 0 refills | Status: DC

## 2021-05-02 NOTE — Progress Notes (Signed)
E-Visit for Poison Ivy  We are sorry that you are not feeing well.  Here is how we plan to help!  Based on what you have shared with me it looks like you have had an allergic reaction to the oily resin from a group of plants.  This resin is very sticky, so it easily attaches to your skin, clothing, tools equipment, and pet's fur.    This blistering rash is often called poison ivy rash although it can come from contact with the leaves, stems and roots of poison ivy, poison oak and poison sumac.  The oily resin contains urushiol (u-ROO-she-ol) that produces a skin rash on exposed skin.  The severity of the rash depends on the amount of urushiol that gets on your skin.  A section of skin with more urushiol on it may develop a rash sooner.  The rash usually develops 12-48 hours after exposure and can last two to three weeks.  Your skin must come in direct contact with the plant's oil to be affected.  Blister fluid doesn't spread the rash.  However, if you come into contact with a piece of clothing or pet fur that has urushiol on it, the rash may spread out.  You can also transfer the oil to other parts of your body with your fingers.  Often the rash looks like a straight line because of the way the plant brushes against your skin.  Since your rash is widespread or has resulted in a large number of blisters, I have prescribed an oral corticosteroid.  Please follow these recommendations:  I have sent a prednisone dose pack to your chosen pharmacy. Be sure to follow the instructions carefully and complete the entire prescription. You may use Benadryl or Caladryl topical lotions to sooth the itch and remember cool, not hot, showers and baths can help relieve the itching!  Place cool, wet compresses on the affected area for 15-30 minutes several times a day.  You may also take oral antihistamines, such as diphenhydramine (Benadryl, others), which may also help you sleep better.  Watch your skin for any purulent  (pus) drainage or red streaking from the site.  If this occurs, contact your provider.  You may require an antibiotic for a skin infection.  Make sure that the clothes you were wearing as well as any towels or sheets that may have come in contact with the oil (urushiol) are washed in detergent and hot water.       I have developed the following plan to treat your condition I am prescribing a two week course of steroids (37 tablets of 10 mg prednisone).  Days 1-4 take 4 tablets (40 mg) daily  Days 5-8 take 3 tablets (30 mg) daily, Days 9-11 take 2 tablets (20 mg) daily, Days 12-14 take 1 tablet (10 mg) daily.    What can you do to prevent this rash?  Avoid the plants.  Learn how to identify poison ivy, poison oak and poison sumac in all seasons.  When hiking or engaging in other activities that might expose you to these plants, try to stay on cleared pathways.  If camping, make sure you pitch your tent in an area free of these plants.  Keep pets from running through wooded areas so that urushiol doesn't accidentally stick to their fur, which you may touch.  Remove or kill the plants.  In your yard, you can get rid of poison ivy by applying an herbicide or pulling it out of   the ground, including the roots, while wearing heavy gloves.  Afterward remove the gloves and thoroughly wash them and your hands.  Don't burn poison ivy or related plants because the urushiol can be carried by smoke.  Wear protective clothing.  If needed, protect your skin by wearing socks, boots, pants, long sleeves and vinyl gloves.  Wash your skin right away.  Washing off the oil with soap and water within 30 minutes of exposure may reduce your chances of getting a poison ivy rash.  Even washing after an hour or so can help reduce the severity of the rash.  If you walk through some poison ivy and then later touch your shoes, you may get some urushiol on your hands, which may then transfer to your face or body by touching or  rubbing.  If the contaminated object isn't cleaned, the urushiol on it can still cause a skin reaction years later.    Be careful not to reuse towels after you have washed your skin.  Also carefully wash clothing in detergent and hot water to remove all traces of the oil.  Handle contaminated clothing carefully so you don't transfer the urushiol to yourself, furniture, rugs or appliances.  Remember that pets can carry the oil on their fur and paws.  If you think your pet may be contaminated with urushiol, put on some long rubber gloves and give your pet a bath.  Finally, be careful not to burn these plants as the smoke can contain traces of the oil.  Inhaling the smoke may result in difficulty breathing. If that occurred you should see a physician as soon as possible.  See your doctor right away if:  The reaction is severe or widespread You inhaled the smoke from burning poison ivy and are having difficulty breathing Your skin continues to swell The rash affects your eyes, mouth or genitals Blisters are oozing pus You develop a fever greater than 100 F (37.8 C) The rash doesn't get better within a few weeks.  If you scratch the poison ivy rash, bacteria under your fingernails may cause the skin to become infected.  See your doctor if pus starts oozing from the blisters.  Treatment generally includes antibiotics.  Poison ivy treatments are usually limited to self-care methods.  And the rash typically goes away on its own in two to three weeks.     If the rash is widespread or results in a large number of blisters, your doctor may prescribe an oral corticosteroid, such as prednisone.  If a bacterial infection has developed at the rash site, your doctor may give you a prescription for an oral antibiotic.  MAKE SURE YOU  Understand these instructions. Will watch your condition. Will get help right away if you are not doing well or get worse.   Thank you for choosing an e-visit.  Your  e-visit answers were reviewed by a board certified advanced clinical practitioner to complete your personal care plan. Depending upon the condition, your plan could have included both over the counter or prescription medications.  Please review your pharmacy choice. Make sure the pharmacy is open so you can pick up prescription now. If there is a problem, you may contact your provider through MyChart messaging and have the prescription routed to another pharmacy.  Your safety is important to us. If you have drug allergies check your prescription carefully.   For the next 24 hours you can use MyChart to ask questions about today's visit, request a non-urgent   call back, or ask for a work or school excuse. You will get an email in the next two days asking about your experience. I hope that your e-visit has been valuable and will speed your recovery.    Approximately 5 minutes was spent documenting and reviewing patient's chart.   

## 2021-06-01 ENCOUNTER — Ambulatory Visit: Payer: Managed Care, Other (non HMO) | Admitting: Gastroenterology

## 2021-06-12 ENCOUNTER — Other Ambulatory Visit: Payer: Self-pay

## 2021-06-12 ENCOUNTER — Ambulatory Visit
Admission: RE | Admit: 2021-06-12 | Discharge: 2021-06-12 | Disposition: A | Discharge status: Home / Self Care | Payer: 59 | Source: Ambulatory Visit | Attending: Emergency Medicine | Admitting: Emergency Medicine

## 2021-06-12 VITALS — BP 121/79 | HR 53 | Temp 98.0°F | Resp 18

## 2021-06-12 DIAGNOSIS — L739 Follicular disorder, unspecified: Secondary | ICD-10-CM

## 2021-06-12 MED ORDER — DOXYCYCLINE HYCLATE 100 MG PO CAPS: 100.0000 mg | ORAL_CAPSULE | Freq: Two times a day (BID) | ORAL | 0 refills | Status: AC

## 2021-06-12 MED ORDER — IBUPROFEN 600 MG PO TABS: 600.0000 mg | ORAL_TABLET | Freq: Four times a day (QID) | ORAL | 0 refills | Status: DC | PRN

## 2021-06-12 NOTE — Discharge Instructions (Addendum)
Please begin doxycycline twice daily for 1 week to treat for possible early folliculitis/abscess Warm compresses Tylenol and ibuprofen for pain Please monitor over the next 3 to 4 days, follow-up if not improving or worsening

## 2021-06-12 NOTE — ED Provider Notes (Signed)
[facility]    CSN: [id number] Arrival date & time: 06/12/21  1116      History   Chief Complaint Chief Complaint  Patient presents with   Arm Pain    HPI Ricky Morgan is a 20 y.o. male presenting today for evaluation of left axillary pain.  Reports this morning began to feel a small bump in his left axilla.  Has been tender to touch.  Denies difficulty moving arm.  Denies fevers.  Denies history of similar.  Does report recently changing deodorant from a stick deodorant to it aerosolized deodorant.  Stresses concern over possible sleep apnea, reports he woke up last night gasping for air.  Has happened on infrequent occasion in the past.  Often will feel as if he has something stuck in his throat like mucus.    HPI  Past Medical History:  Diagnosis Date   Anxiety    Asthma    Urinary incontinence    summer 08/2020   Wears glasses     Patient Active Problem List   Diagnosis Date Noted   Chest wall pain 11/29/2020   RUQ abdominal pain 11/29/2020   Gastroesophageal reflux disease 11/29/2020   Sore throat 10/27/2020   Close exposure to COVID-19 virus 10/27/2020   Head congestion 10/27/2020   Abdominal pain 10/27/2020   Abdominal bloating 10/27/2020   Constipation 10/27/2020   Encounter for health maintenance examination in adult 09/08/2020   Lightheaded 09/08/2020   Dizziness 09/08/2020   Vaccine counseling 09/08/2020   Screen for STD (sexually transmitted disease) 09/08/2020   Palpitation 09/08/2020   Abdominal discomfort 09/08/2020   Screening for lipid disorders 09/08/2020   Screening for diabetes mellitus 09/08/2020    Past Surgical History:  Procedure Laterality Date   ADENOIDECTOMY         Home Medications    Prior to Admission medications   Medication Sig Start Date End Date Taking? Authorizing Provider  doxycycline (VIBRAMYCIN) 100 MG capsule Take 1 capsule (100 mg total) by mouth 2 (two) times daily for 7 days. 06/12/21 06/19/21 Yes  Annise Boran C, PA-C  ibuprofen (ADVIL) 600 MG tablet Take 1 tablet (600 mg total) by mouth every 6 (six) hours as needed. 06/12/21  Yes Kennya Schwenn C, PA-C  Digestive Enzymes (DIGESTIVE ENZYME PO) Take 2 tablets by mouth daily.    [provider]  predniSONE (DELTASONE) 10 MG tablet Days 1-4 take 4 tablets (40 mg) daily  Days 5-8 take 3 tablets (30 mg) daily, Days 9-11 take 2 tablets (20 mg) daily, Days 12-14 take 1 tablet (10 mg) daily. 05/02/21   Jannifer Rodney A, FNP  Psyllium (METAMUCIL) 48.57 % POWD Take 1 Dose by mouth daily.    [provider]    Family History Family History  Problem Relation Age of Onset   Hypertension Father    Hyperlipidemia Father    Diabetes Father        prediabetes   Gallbladder disease Father    Diabetes Paternal Grandmother    Hypertension Paternal Grandmother    Cancer Neg Hx    Heart disease Neg Hx     Social History Social History   Tobacco Use   Smoking status: Never   Smokeless tobacco: Never  Vaping Use   Vaping Use: Never used  Substance Use Topics   Alcohol use: Yes    Comment: occasionally   Drug use: Never     Allergies   Patient has no known allergies.   Review  of Systems Review of Systems  Constitutional:  Negative for fatigue and fever.  Eyes:  Negative for redness, itching and visual disturbance.  Respiratory:  Negative for shortness of breath.   Cardiovascular:  Negative for chest pain and leg swelling.  Gastrointestinal:  Negative for nausea and vomiting.  Musculoskeletal:  Negative for arthralgias and myalgias.  Skin:  Negative for color change, rash and wound.  Neurological:  Negative for dizziness, syncope, weakness, light-headedness and headaches.    Physical Exam Triage Vital Signs ED Triage Vitals  Enc Vitals Group     BP      Pulse      Resp      Temp      Temp src      SpO2      Weight      Height      Head Circumference      Peak Flow      Pain Score      Pain Loc       Pain Edu?      Excl. in GC?    No data found.  Updated Vital Signs BP 121/79 (BP Location: Left Arm)   Pulse (!) 53   Temp 98 F (36.7 C) (Oral)   Resp 18   SpO2 98%   Visual Acuity Right Eye Distance:   Left Eye Distance:   Bilateral Distance:    Right Eye Near:   Left Eye Near:    Bilateral Near:     Physical Exam Vitals and nursing note reviewed.  Constitutional:      Appearance: He is well-developed.     Comments: No acute distress  HENT:     Head: Normocephalic and atraumatic.     Nose: Nose normal.  Eyes:     Conjunctiva/sclera: Conjunctivae normal.  Cardiovascular:     Rate and Rhythm: Normal rate.  Pulmonary:     Effort: Pulmonary effort is normal. No respiratory distress.  Abdominal:     General: There is no distension.  Musculoskeletal:        General: Normal range of motion.     Cervical back: Neck supple.     Comments: Full active range of motion of left shoulder  Skin:    General: Skin is warm and dry.     Comments: Left axilla with small bump, possible induration noted around central hair follicle, no significant overlying erythema, no fluctuance  Neurological:     Mental Status: He is alert and oriented to person, place, and time.     UC Treatments / Results  Labs (all labs ordered are listed, but only abnormal results are displayed) Labs Reviewed - No data to display  EKG   Radiology No results found.  Procedures Procedures (including critical care time)  Medications Ordered in UC Medications - No data to display  Initial Impression / Assessment and Plan / UC Course  I have reviewed the triage vital signs and the nursing notes.  Pertinent labs & imaging results that were available during my care of the patient were reviewed by me and considered in my medical decision making (see chart for details).     Left axilla bump-possible lymphadenopathy versus early folliculitis versus abscess.  Empirically treating with doxycycline  recommended warm compresses anti-inflammatories and close monitoring.  Possible sleep apnea-encouraged daily Zyrtec or Claritin if having postnasal drainage waking up in the middle of the night, continue to monitor, if becoming recurrent to follow-up with PCP for further discussion of  need for sleep study  Discussed strict return precautions. Patient verbalized understanding and is agreeable with plan.  Final Clinical Impressions(s) / UC Diagnoses   Final diagnoses:  Folliculitis of left axilla     Discharge Instructions      Please begin doxycycline twice daily for 1 week to treat for possible early folliculitis/abscess Warm compresses Tylenol and ibuprofen for pain Please monitor over the next 3 to 4 days, follow-up if not improving or worsening     ED Prescriptions     Medication Sig Dispense Auth. Provider   doxycycline (VIBRAMYCIN) 100 MG capsule Take 1 capsule (100 mg total) by mouth 2 (two) times daily for 7 days. 14 capsule Isam Unrein C, PA-C   ibuprofen (ADVIL) 600 MG tablet Take 1 tablet (600 mg total) by mouth every 6 (six) hours as needed. 30 tablet Airiana Elman, [place] C, PA-C      PDMP not reviewed this encounter.   Lew Dawes, [place] 06/12/21 1156

## 2021-06-12 NOTE — ED Triage Notes (Signed)
Patient states today he noticed lump in left axillary region. Lump is tender when touched per patient. He states he did recently change his Deoderant. C/o possibly having sleep apnea, states last night he woke up gasping for air. Patient denies SOB at this time.

## 2021-07-10 ENCOUNTER — Telehealth: Payer: Managed Care, Other (non HMO) | Admitting: Nurse Practitioner

## 2021-07-10 DIAGNOSIS — K649 Unspecified hemorrhoids: Secondary | ICD-10-CM | POA: Diagnosis not present

## 2021-07-10 MED ORDER — HYDROCORTISONE (PERIANAL) 2.5 % EX CREA: 1.0000 "application " | TOPICAL_CREAM | Freq: Two times a day (BID) | CUTANEOUS | 0 refills | Status: DC

## 2021-07-10 NOTE — Progress Notes (Signed)
E-Visit for Hemorrhoid  We are sorry that you are not feeling well. We are here to help!  Hemorrhoids are swollen veins in the rectum. They can cause itching, bleeding, and pain. Hemorrhoids are very common.  In some cases, you can see or feel hemorrhoids around the outside of the rectum. In other cases, you cannot see them because they are hidden inside the rectum. Be patient - It can take months for this to improve or go away.   Hemorrhoids do not always cause symptoms. But when they do, symptoms can include: ?Itching of the skin around the anus ?Bleeding - Bleeding is usually painless. You might see bright red blood after using the toilet. ?Pain - If a blood clot forms inside a hemorrhoid, this can cause pain. It can also cause a lump that you might be able to feel.   What can I do to keep from getting more hemorrhoids? -- The most important thing you can do is to keep from getting constipated. You should have a bowel movement at least a few times a week. When you have a bowel movement, you also should not have to push too much. Plus, your bowel movements should not be too hard. Being constipated and having hard bowel movements can make hemorrhoids worse.   I have prescribed Topical Hydrocortisone ointment 2.5%.  Apply to area two times per day for 30 days  HOME CARE: Sitz Baths twice daily. Soak buttocks in 2 or 3 inches of warm water for 10 to 15 minutes. Do not add soap, bubble bath, or anything to the water. Stool softener such as Colace 100 mg twice daily AND Miralax 1 scoop daily until you have regular soft stools Over the counter Preparation H Tucks Pads Witch Hazel  Here are some steps you can take to avoid getting constipated or having hard stools:  ?Eat lots of fruits, vegetables, and other foods with fiber. Fiber helps to increase bowel movements. If you do not get enough fiber from your diet, you can take fiber supplements. These come in the form of powders, wafers, or  pills. Some examples are Metamucil, Citrucel, Benefiber and FiberCon. If you take a fiber supplement, be sure to read the label so you know how much to take. If you're not sure, ask your provider or nurse. ?Take medicines called "stool softeners" such as docusate sodium (sample brand names: Colace, Dulcolax). These medicines increase the number of bowel movements you have. They are safe to take and they can prevent problems later.  You should request a referral for a follow up evaluation with a Gastroenterologist (GI doctor) to evaluate this chronic and relapsing condition - even if it improves to see what further steps need to be taken. This is highly linked to chronic constipation and straining to have a bowel movement. It may require further treatment or surgical intervention.   GET HELP RIGHT AWAY IF: You develop severe pain You have heavy bleeding   FOLLOW UP WITH YOUR PRIMARY PROVIDER IF: If your symptoms do not improve within 10 days  MAKE SURE YOU  Understand these instructions. Will watch your condition. Will get help right away if you are not doing well or get worse.   Thank you for choosing an e-visit.  Your e-visit answers were reviewed by a board certified advanced clinical practitioner to complete your personal care plan. Depending upon the condition, your plan could have included both over the counter or prescription medications.  Please review your pharmacy choice. Make   sure the pharmacy is open so you can pick up prescription now. If there is a problem, you may contact your provider through [employer] and have the prescription routed to another pharmacy.  Your safety is important to [place]. If you have drug allergies check your prescription carefully.   For the next 24 hours you can use MyChart to ask questions about today's visit, request a non-urgent call back, or ask for a work or school excuse. You will get an email in the next two days asking about your experience.  I hope that your e-visit has been valuable and will speed your recovery.   I spent approximately 10 minutes reviewing the patient's history, current symptoms and coordinating their care today.    Meds ordered this encounter  Medications   hydrocortisone (ANUSOL-HC) 2.5 % rectal cream    Sig: Place 1 application rectally 2 (two) times daily.    Dispense:  30 g    Refill:  0

## 2021-07-13 ENCOUNTER — Other Ambulatory Visit: Payer: Self-pay

## 2021-07-13 ENCOUNTER — Telehealth (INDEPENDENT_AMBULATORY_CARE_PROVIDER_SITE_OTHER): Payer: 59 | Admitting: Medical

## 2021-07-13 ENCOUNTER — Encounter: Payer: Self-pay | Admitting: Medical

## 2021-07-13 VITALS — Wt 285.0 lb

## 2021-07-13 DIAGNOSIS — F419 Anxiety disorder, unspecified: Secondary | ICD-10-CM | POA: Insufficient documentation

## 2021-07-13 DIAGNOSIS — K5900 Constipation, unspecified: Secondary | ICD-10-CM

## 2021-07-13 DIAGNOSIS — R109 Unspecified abdominal pain: Secondary | ICD-10-CM | POA: Diagnosis not present

## 2021-07-13 DIAGNOSIS — K649 Unspecified hemorrhoids: Secondary | ICD-10-CM

## 2021-07-13 DIAGNOSIS — K921 Melena: Secondary | ICD-10-CM | POA: Diagnosis not present

## 2021-07-13 MED ORDER — HYDROCORTISONE ACETATE 25 MG RE SUPP: 25.0000 mg | Freq: Two times a day (BID) | RECTAL | 2 refills | Status: DC

## 2021-07-13 NOTE — Progress Notes (Signed)
Subjective:     Patient ID: Ricky Morgan, male   DOB: [birth year], 20 y.o.   MRN: [id number]  This visit type was conducted due to national recommendations for restrictions regarding the COVID-19 Pandemic (e.g. social distancing) in an effort to limit this patient's exposure and mitigate transmission in our community.  Due to their co-morbid illnesses, this patient is at least at moderate risk for complications without adequate follow up.  This format is felt to be most appropriate for this patient at this time.    Documentation for virtual audio and video telecommunications through [facility] encounter:  The patient was located at home. The provider was located in the office. The patient did consent to this visit and is aware of possible charges through their insurance for this visit.  The other persons participating in this telemedicine service were none. Time spent on call was 20 minutes and in review of previous records 20 minutes total.  This virtual service is not related to other E/M service within previous 7 days.   HPI Chief Complaint  Patient presents with   strugging to use the restroom    Struggling with bowel movement. It was having some blood in the poop. Taking miralax and some relief but still straining    Virtual consult today for abdominal discomfort, constipation.  Having some constipation  This week he had about 3 days of some bright red blood in the toilet bowl.  He has had this maybe once before.  He does have some constipation and had trouble with this over the weekend.  He does use MiraLAX daily over the weekend he had 1 day with a particularly difficult bowel movement and then the blood started shortly thereafter.  The blood has resolved now.  No pain at the anus or with wiping.  He saw gastroenterology in June after my referral for chronic abdominal pain.  They started the MiraLAX.  He has been doing really well with fiber and water intake.  Sometimes too much  fiber will blow him.  Otherwise he has been doing fairly well with the abdominal issues since June  He would like a referral to counseling to help with some mental health concerns.  He is wanting to know the best way to get an appointment with counselor.   Past Medical History:  Diagnosis Date   Anxiety    Asthma    Urinary incontinence    summer 08/2020   Wears glasses    Current Outpatient Medications on File Prior to Visit  Medication Sig Dispense Refill   Digestive Enzymes (DIGESTIVE ENZYME PO) Take 2 tablets by mouth daily.     Psyllium (METAMUCIL) 48.57 % POWD Take 1 Dose by mouth daily.     No current facility-administered medications on file prior to visit.     Review of Systems As in subjective    Objective:   Physical Exam Due to coronavirus pandemic stay at home measures, patient visit was virtual and they were not examined in person.   Wt 285 lb (129.3 kg)   BMI 38.12 kg/m   Wt Readings from Last 3 Encounters:  07/13/21 285 lb (129.3 kg)  03/28/21 (!) 300 lb 4 oz (136.2 kg)  11/29/20 262 lb 12.8 oz (119.2 kg)   Gen: Well-developed, well-nourished, no acute distress Psych: Pleasant, answers questions appropriately      Assessment:     Encounter Diagnoses  Name Primary?   Blood in stool Yes   Hemorrhoids, unspecified hemorrhoid type  Constipation, unspecified constipation type    Abdominal pain, unspecified abdominal location    Anxiety        Plan:     Recent blood in the stool likely from internal hemorrhoids from where he had a recent bout of straining and constipation.  He can use the steroid suppositories below in the future when this flares up along with hot soapy baths and over-the-counter stool softener for a few days at a time.  In general continue efforts with good fiber and water intake.  He is doing both.  Continue the MiraLAX daily.  Blood in the stool-we discussed other potential causes of blood in the stool but this seems isolated  and related to hemorrhoids.  No symptoms suggestive of fissure or other  Chronic abdominal pain-he had a consult with gastroenterology in June of this year.  I reviewed 03/2021 GI consult notes.  At that time they recommended miralax daily, repeat ultrasound in a year, losing weight, limiting alcohol, reducing carbohydrates.  Anxiety-we discussed his concerns.  Referral through [facility] for counseling   Ricky Morgan was seen today for strugging to use the restroom.  Diagnoses and all orders for this visit:  Blood in stool  Hemorrhoids, unspecified hemorrhoid type  Constipation, unspecified constipation type  Abdominal pain, unspecified abdominal location  Anxiety  Other orders -     hydrocortisone (ANUSOL-HC) 25 MG suppository; Place 1 suppository (25 mg total) rectally 2 (two) times daily.  F/u pending call back about counseling

## 2021-07-25 ENCOUNTER — Encounter: Payer: Self-pay | Admitting: Gastroenterology

## 2021-07-25 ENCOUNTER — Ambulatory Visit: Payer: Managed Care, Other (non HMO) | Admitting: Gastroenterology

## 2021-07-25 ENCOUNTER — Other Ambulatory Visit (INDEPENDENT_AMBULATORY_CARE_PROVIDER_SITE_OTHER): Payer: Managed Care, Other (non HMO)

## 2021-07-25 VITALS — BP 120/74 | HR 53 | Ht 72.5 in | Wt 294.4 lb

## 2021-07-25 DIAGNOSIS — R1010 Upper abdominal pain, unspecified: Secondary | ICD-10-CM | POA: Diagnosis not present

## 2021-07-25 DIAGNOSIS — K824 Cholesterolosis of gallbladder: Secondary | ICD-10-CM

## 2021-07-25 DIAGNOSIS — K625 Hemorrhage of anus and rectum: Secondary | ICD-10-CM | POA: Diagnosis not present

## 2021-07-25 DIAGNOSIS — K219 Gastro-esophageal reflux disease without esophagitis: Secondary | ICD-10-CM | POA: Diagnosis not present

## 2021-07-25 DIAGNOSIS — K649 Unspecified hemorrhoids: Secondary | ICD-10-CM | POA: Diagnosis not present

## 2021-07-25 DIAGNOSIS — K5900 Constipation, unspecified: Secondary | ICD-10-CM

## 2021-07-25 LAB — H. PYLORI ANTIBODY, IGG: H Pylori IgG: NEGATIVE

## 2021-07-25 MED ORDER — PANTOPRAZOLE SODIUM 40 MG PO TBEC: 40.0000 mg | DELAYED_RELEASE_TABLET | Freq: Every day | ORAL | 0 refills | Status: DC

## 2021-07-25 MED ORDER — POLYETHYLENE GLYCOL 3350 17 G PO PACK: 17.0000 g | PACK | Freq: Every day | ORAL | 0 refills | Status: DC | PRN

## 2021-07-25 NOTE — Progress Notes (Signed)
HPI :  20 year old male here for a follow-up visit for abdominal pain, constipation, rectal bleeding.  He was seen by Alcide Evener this past June, primarily discussed abdominal pain at that time.  He states February 2021, he noticed upper abdominal pain.  He points to his right upper quadrant and epigastric to left upper quadrant areas as location of his pain.  States he will feel this on average once per day, sometimes more.  It is often fleeting and lasts less than 1 minute at a time.  He has a hard time identifying a clear food that we will do this but often fried or fatty foods can make this occur more frequently.  He does have some radiation to his back when the pain is located in the right side.  It is unclear if there may be a positional component to this as well, at times he is really noticed change positions can help relieve it.  He states is more of a nuisance at this point time and he is able to function well.  He works for [employer] and this does not limit his ability to work.  He does endorse some occasional reflux symptoms with regurgitation at times.  He denies any dysphagia.  He does have nausea associate with his pain at times but he does not vomit.  No other abdominal pains or gas.  He has dealt with constipation in recent months.  States he had an episode of rectal bleeding 6 months ago as well as about 2 weeks ago.  He describes bright red blood noted in the stool in the toilet bowl.  He has been using Metamucil daily with MiraLAX as needed for the past several weeks and states this is really helped his bowel regimen and not having much bleeding.  He states if he cuts out carbohydrates from his diet he also has less constipation.  He denies any family history of colon cancer.  No changes in his weight.  He states he was tried on omeprazole for a week or 2 perhaps over a year ago but this made him constipated and he stopped it.  Since his last visit he had some basic labs done  which were normal, no anemia or leukocytosis.  LFTs normal.  He had an ultrasound of his abdomen this past March which showed a 6 mm gallbladder polyp but no gallstones or other acute pathology.  At the last visit it was recommended he have H. pylori testing, he failed to follow-up for stool test.  It was recommended to try Pepcid once daily but he also forgot to do that.  He did have a negative celiac serology.     Complete US sonogram 12/26/2020:  FINDINGS: Gallbladder: 2 small gallbladder polyps, the largest 6 mm. No stone disease. No wall thickening. No Murphy sign.   Common bile duct: Diameter: 3 mm common normal   Liver: Heterogeneous echotexture suggesting possible mild fatty change. No focal lesion. No ductal dilatation. Portal vein is patent on color Doppler imaging with normal direction of blood flow towards the liver.   IVC: No abnormality visualized.   Pancreas: Poorly seen because of overlying bowel gas.   Spleen: Size and appearance within normal limits.   Right Kidney: Length: 13.2 cm. Echogenicity within normal limits. No mass or hydronephrosis visualized.   Left Kidney: Length: 13.3 cm. Echogenicity within normal limits. No mass or hydronephrosis visualized.   Abdominal aorta: No aneurysm visualized.   Other findings: No ascites  Past Medical History:  Diagnosis Date   Anxiety    Asthma    Urinary incontinence    summer 08/2020   Wears glasses      Past Surgical History:  Procedure Laterality Date   ADENOIDECTOMY     Family History  Problem Relation Age of Onset   Hypertension Father    Hyperlipidemia Father    Diabetes Father        prediabetes   Gallbladder disease Father    Diabetes Paternal Grandmother    Hypertension Paternal Grandmother    Cancer Neg Hx    Heart disease Neg Hx    Social History   Tobacco Use   Smoking status: Never   Smokeless tobacco: Never  Vaping Use   Vaping Use: Never used  Substance Use Topics   Alcohol  use: Yes    Comment: occasionally   Drug use: Never   Current Outpatient Medications  Medication Sig Dispense Refill   Digestive Enzymes (DIGESTIVE ENZYME PO) Take 2 tablets by mouth daily.     Psyllium (METAMUCIL) 48.57 % POWD Take 1 Dose by mouth daily.     No current facility-administered medications for this visit.   No Known Allergies   Review of Systems: All systems reviewed and negative except where noted in HPI.   Lab Results  Component Value Date   WBC 4.6 03/28/2021   HGB 14.3 03/28/2021   HCT 41.8 03/28/2021   MCV 86.1 03/28/2021   PLT 280.0 03/28/2021    Lab Results  Component Value Date   CREATININE 0.92 03/28/2021   BUN 15 03/28/2021   NA 137 03/28/2021   K 4.2 03/28/2021   CL 104 03/28/2021   CO2 25 03/28/2021    Lab Results  Component Value Date   ALT 25 03/28/2021   AST 27 03/28/2021   ALKPHOS 58 03/28/2021   BILITOT 0.4 03/28/2021     Physical Exam: BP 120/74   Pulse (!) 53   Ht 6' 0.5" (1.842 m)   Wt 294 lb 6 oz (133.5 kg)   BMI 39.38 kg/m  Constitutional: Pleasant,well-developed, male in no acute distress. HEENT: Normocephalic and atraumatic. Conjunctivae are normal. No scleral icterus. Abdominal: Soft, nondistended, nontender. There are no masses palpable.  DRE / Anoscopy - CMA Stefany Paige - no fissure, no mass lesions. Internal hemorrhoids noted on anoscopy, no other concerning pathology Extremities: no edema Neurological: Alert and oriented to person place and time. Skin: Skin is warm and dry. No rashes noted. Psychiatric: Normal mood and affect. Behavior is normal.   ASSESSMENT AND PLAN: 20 year old male here for reassessment following:  Upper abdominal pain GERD Gallbladder polyp Constipation Rectal bleeding Hemorrhoids  As above, intermittent postprandial upper abdominal discomfort and mainly right upper quadrant and epigastric area.  Short-lived and a mild nuisance but has persisted.  Labs are reassuring.  Ultrasound  shows no gallstones but he does have a small gallbladder polyp.  Discussed differential diagnosis with him.  He does have some reflux at times, we discussed GERD/esophagitis, gastritis, PUD, functional dyspepsia, musculoskeletal pain also possible.  I do think a trial of daily antacid for 4 weeks is reasonable to see if he responds.  We will give him a trial of Protonix 40 mg a day for 30 days after discussion of options.  I do think he warrants H. pylori testing, he failed to follow-up for a stool test previously.  We will send him to the lab for H pylori IgG serology today.  Biliary  colic remains possible.  Reviewed his 6 mm gallbladder polyp with him, some experts recommend follow-up exam in 6 months to reassess size over time.  I think that is reasonable given his symptoms, we will set him up for another ultrasound.  Otherwise in regards to his rectal bleeding, this is very likely due to hemorrhoids noted on anoscopy today in the setting of constipation.  He is found of a good bowel regimen with Metamucil daily with MiraLAX as needed and should continue that.  If bleeding persists he should let me know.  Sounds like only a few episodes today so far  Plan: - H pylori IgG serology - trial of protonix 40mg  / day for 30 day trial - RUQ  - surveillance of gallbladder polyp - Metamucil daily and Miralax PRN for constipation / hemorrhoids  Further recommendations pending results and the patient's course. Consider EGD if no improvement.  [place], MD [facility]

## 2021-07-25 NOTE — Patient Instructions (Addendum)
If you are age 22 or older, your body mass index should be between 23-30. Your Body mass index is 39.38 kg/m. If this is out of the aforementioned range listed, please consider follow up with your Primary Care Provider.  If you are age 36 or younger, your body mass index should be between 19-25. Your Body mass index is 39.38 kg/m. If this is out of the aformentioned range listed, please consider follow up with your Primary Care Provider.   __________________________________________________________  The [facility] providers would like to encourage you to use [facility] to communicate with providers for non-urgent requests or questions.  Due to long hold times on the telephone, sending your provider a message by [facility] may be a faster and more efficient way to get a response.  Please allow 48 business hours for a response.  Please remember that this is for non-urgent requests.   Please go to the lab in the basement of our building to have lab work done as you leave today. Hit "B" for basement when you get on the elevator.  When the doors open the lab is on your left.  We will call you with the results. Thank you.  You will be contacted by [facility] Scheduling in the next 2 days to arrange a Abdominal Ultrasound.  The number on your caller ID will be [phone], please answer when they call.  If you have not heard from them in 2 days please call [phone] to schedule.    We have sent the following medications to your pharmacy for you to pick up at your convenience: Protonix 40 mg: Take once daily for 30 day trial  Continue Metamucil daily and Miralax as needed  Thank you for entrusting me with your care and for choosing [facility], Dr. Ileene Patrick

## 2021-08-02 ENCOUNTER — Ambulatory Visit (HOSPITAL_COMMUNITY)
Admission: RE | Admit: 2021-08-02 | Discharge: 2021-08-02 | Disposition: A | Discharge status: Home / Self Care | Payer: Managed Care, Other (non HMO) | Source: Ambulatory Visit | Attending: Gastroenterology | Admitting: Gastroenterology

## 2021-08-02 ENCOUNTER — Other Ambulatory Visit: Payer: Self-pay

## 2021-08-02 DIAGNOSIS — K649 Unspecified hemorrhoids: Secondary | ICD-10-CM | POA: Insufficient documentation

## 2021-08-02 DIAGNOSIS — K824 Cholesterolosis of gallbladder: Secondary | ICD-10-CM | POA: Diagnosis present

## 2021-08-02 DIAGNOSIS — R1010 Upper abdominal pain, unspecified: Secondary | ICD-10-CM | POA: Diagnosis not present

## 2021-08-02 DIAGNOSIS — K219 Gastro-esophageal reflux disease without esophagitis: Secondary | ICD-10-CM | POA: Diagnosis present

## 2021-08-02 DIAGNOSIS — K625 Hemorrhage of anus and rectum: Secondary | ICD-10-CM | POA: Diagnosis present

## 2021-08-02 DIAGNOSIS — K5900 Constipation, unspecified: Secondary | ICD-10-CM | POA: Insufficient documentation

## 2021-08-22 ENCOUNTER — Other Ambulatory Visit: Payer: Self-pay | Admitting: Gastroenterology

## 2021-09-06 ENCOUNTER — Other Ambulatory Visit: Payer: Self-pay

## 2021-09-06 ENCOUNTER — Ambulatory Visit: Payer: 59 | Admitting: Medical

## 2021-09-06 DIAGNOSIS — R350 Frequency of micturition: Secondary | ICD-10-CM

## 2021-09-06 DIAGNOSIS — K5900 Constipation, unspecified: Secondary | ICD-10-CM

## 2021-09-06 DIAGNOSIS — R3915 Urgency of urination: Secondary | ICD-10-CM

## 2021-09-06 LAB — POCT URINALYSIS DIP (CLINITEK)
Bilirubin, UA: NEGATIVE
Blood, UA: NEGATIVE
Glucose, UA: NEGATIVE mg/dL
Ketones, POC UA: NEGATIVE mg/dL
Leukocytes, UA: NEGATIVE
Nitrite, UA: NEGATIVE
POC PROTEIN,UA: NEGATIVE
Spec Grav, UA: 1.015 (ref 1.010–1.025)
Urobilinogen, UA: 0.2 E.U./dL
pH, UA: 8 (ref 5.0–8.0)

## 2021-09-06 LAB — POCT URINALYSIS DIP (PROADVANTAGE DEVICE)
Bilirubin, UA: NEGATIVE
Blood, UA: NEGATIVE
Glucose, UA: NEGATIVE mg/dL
Ketones, POC UA: NEGATIVE mg/dL
Leukocytes, UA: NEGATIVE
Nitrite, UA: NEGATIVE
Protein Ur, POC: NEGATIVE mg/dL
Specific Gravity, Urine: 1.015
Urobilinogen, Ur: 0.2
pH, UA: 8 (ref 5.0–8.0)

## 2021-09-06 MED ORDER — TOLTERODINE TARTRATE 1 MG PO TABS: 1.0000 mg | ORAL_TABLET | Freq: Two times a day (BID) | ORAL | 1 refills | Status: DC

## 2021-09-06 NOTE — Progress Notes (Signed)
Subjective:     Patient ID: Ricky Morgan, male   DOB: December 02, 2000, 20 y.o.   MRN: [id number]  This visit type was conducted due to national recommendations for restrictions regarding the COVID-19 Pandemic (e.g. social distancing) in an effort to limit this patient's exposure and mitigate transmission in our community.  Due to their co-morbid illnesses, this patient is at least at moderate risk for complications without adequate follow up.  This format is felt to be most appropriate for this patient at this time.    Documentation for virtual audio and video telecommunications through [place] encounter:  The patient was located at home. The provider was located in the office. The patient did consent to this visit and is aware of possible charges through their insurance for this visit.  The other persons participating in this telemedicine service were none. Time spent on call was 20 minutes and in review of previous records 20 minutes total.  This virtual service is not related to other E/M service within previous 7 days.   HPI Chief Complaint  Patient presents with   Urinary Frequency   Here for urine complaint.  He notes for probably the last year he has had problems with urination.  He has urinary frequency and urgency all the time.  He also gets some dribbling at the end of the urine stream.  He denies urine odor, no cloudy urine, no penile discharge, no testicle pain or swelling, no genital rash, no fever, no body aches or chills.  He has a history of constipation but that has not been an issue in a while.  He is in a steady relationship, no concern for STD  He feels like he cannot hold his urine sometimes but he does not have incontinence.  No blood in the urine.  He denies belly pain, back pain.  Drinks limited alcohol.  He drinks caffeine only a few times per week.  He drinks water regularly but he uses electrolyte packs in his water regularly  Urge to urinate, can't hold at  times, very bothersome, x a year  No history of pelvic injury, no history of nerve damage, spinal injury or leg injury.  Past Medical History:  Diagnosis Date   Anxiety    Asthma    Urinary incontinence    summer 08/2020   Wears glasses    Current Outpatient Medications on File Prior to Visit  Medication Sig Dispense Refill   Digestive Enzymes (DIGESTIVE ENZYME PO) Take 2 tablets by mouth daily.     pantoprazole (PROTONIX) 40 MG tablet TAKE 1 TABLET BY MOUTH EVERY DAY 30 tablet 2   polyethylene glycol (MIRALAX) 17 g packet Take 17 g by mouth daily as needed. 14 each 0   Psyllium (METAMUCIL) 48.57 % POWD Take 1 Dose by mouth daily.     No current facility-administered medications on file prior to visit.    Review of Systems As in subjective    Objective:   Physical Exam Due to coronavirus pandemic stay at home measures, patient visit was virtual and they were not examined in person.   There were no vitals taken for this visit.  General: Well-developed well-nourished no acute distress Otherwise not examined     Assessment:     Encounter Diagnoses  Name Primary?   Increased urinary frequency Yes   Urgency of urination    Constipation, unspecified constipation type        Plan:     We discussed his symptoms and concerns  and possible differential.  He did come by and drop off a urine today that was unremarkable other than pH of 8.  I will send his urine for culture.  Advised he limit or cut back on the electrolyte packets he is using and focus more with just plain water.  Advise 80 to 100 ounces of water daily.  Begin trial of Detrol assuming urine culture comes back negative.  Discussed risk and benefits and proper use of medicine.  We will recheck soon as we will likely refer to urology for further evaluation  Constipation has not been an issue of late  Ricky Morgan was seen today for urinary frequency.  Diagnoses and all orders for this visit:  Increased urinary  frequency -     POCT URINALYSIS DIP (CLINITEK) -     Urine Culture -     POCT Urinalysis DIP (Proadvantage Device)  Urgency of urination -     Urine Culture -     POCT Urinalysis DIP (Proadvantage Device)  Constipation, unspecified constipation type  Other orders -     tolterodine (DETROL) 1 MG tablet; Take 1 tablet (1 mg total) by mouth 2 (two) times daily.   F/u pending labs

## 2021-09-10 LAB — URINE CULTURE

## 2021-10-09 ENCOUNTER — Telehealth: Payer: Managed Care, Other (non HMO) | Admitting: Physician Assistant

## 2021-10-09 DIAGNOSIS — R6889 Other general symptoms and signs: Secondary | ICD-10-CM

## 2021-10-09 NOTE — Progress Notes (Signed)

## 2021-10-09 NOTE — Progress Notes (Signed)
I have spent 5 minutes in review of e-visit questionnaire, review and updating patient chart, medical decision making and response to patient.   Rontrell Moquin Cody Jadelyn Elks, PA-C    

## 2021-10-26 ENCOUNTER — Other Ambulatory Visit: Payer: Self-pay

## 2021-10-26 ENCOUNTER — Telehealth: Payer: 59 | Admitting: Medical

## 2021-10-31 ENCOUNTER — Other Ambulatory Visit: Payer: Self-pay

## 2021-10-31 ENCOUNTER — Ambulatory Visit: Payer: 59 | Admitting: Medical

## 2021-10-31 VITALS — BP 120/70 | HR 58 | Temp 97.2°F | Wt 307.4 lb

## 2021-10-31 DIAGNOSIS — N50812 Left testicular pain: Secondary | ICD-10-CM

## 2021-10-31 LAB — POCT URINALYSIS DIP (PROADVANTAGE DEVICE)
Bilirubin, UA: NEGATIVE
Blood, UA: NEGATIVE
Glucose, UA: NEGATIVE mg/dL
Ketones, POC UA: NEGATIVE mg/dL
Leukocytes, UA: NEGATIVE
Nitrite, UA: NEGATIVE
Protein Ur, POC: NEGATIVE mg/dL
Specific Gravity, Urine: 1.015
Urobilinogen, Ur: NEGATIVE
pH, UA: 6 (ref 5.0–8.0)

## 2021-10-31 NOTE — Progress Notes (Signed)
Subjective: Chief Complaint  Patient presents with   testical pain    Left testicle pain x 5 months    For a few weeks having a dull aching pain on left testicle.   Around the top of left testicle, it feels different than right side.  Veins felt even warm.  No fever, no body aches or chills.  No penile discharge.   Having some urinary frequency.  No urgency, cloudy urine or odorous urine.   Sexual active, in relationship with same partner of 4 years.  No concern for STD.    No scrotal injury.    No family hx/o testicular cancer.    No back or abdominal pain.  Past Medical History:  Diagnosis Date   Anxiety    Asthma    Urinary incontinence    summer 08/2020   Wears glasses    No current outpatient medications on file prior to visit.   No current facility-administered medications on file prior to visit.   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   Testicular pain, left Yes     Plan: We discussed possible differential for testicular pain. Labs as below If labs normal we will likely pursue testicular ultrasound Begin Aleve twice daily for the next 10 days We may add antibiotic for orchitis in general if the STD  screening is negative  Albi was seen today for testical pain.  Diagnoses and all orders for this visit:  Testicular pain, left -     POCT Urinalysis DIP (Proadvantage Device) -     Chlamydia/Gonococcus/Trichomonas, NAA   F/u pending labs

## 2021-11-02 ENCOUNTER — Other Ambulatory Visit: Payer: Self-pay | Admitting: Medical

## 2021-11-02 DIAGNOSIS — N50812 Left testicular pain: Secondary | ICD-10-CM

## 2021-11-02 LAB — CHLAMYDIA/GONOCOCCUS/TRICHOMONAS, NAA
Chlamydia by NAA: NEGATIVE
Gonococcus by NAA: NEGATIVE
Trich vag by NAA: NEGATIVE

## 2021-11-02 MED ORDER — SULFAMETHOXAZOLE-TRIMETHOPRIM 800-160 MG PO TABS: 1.0000 | ORAL_TABLET | Freq: Two times a day (BID) | ORAL | 0 refills | Status: AC

## 2021-11-22 ENCOUNTER — Ambulatory Visit (HOSPITAL_COMMUNITY): Payer: Managed Care, Other (non HMO)

## 2021-12-05 IMAGING — US US ABDOMEN LIMITED
1 series · 14 of 25 positions shown · non-contrast
Comparison: Abdominal ultrasound 12/26/2020

CLINICAL DATA: Pain, gallbladder polyp

EXAM:
ULTRASOUND ABDOMEN LIMITED RIGHT UPPER QUADRANT

[Series 1: us abdomen limited ruq (liver/gb) · 14 of 65 slices shown]
[im 1/65]
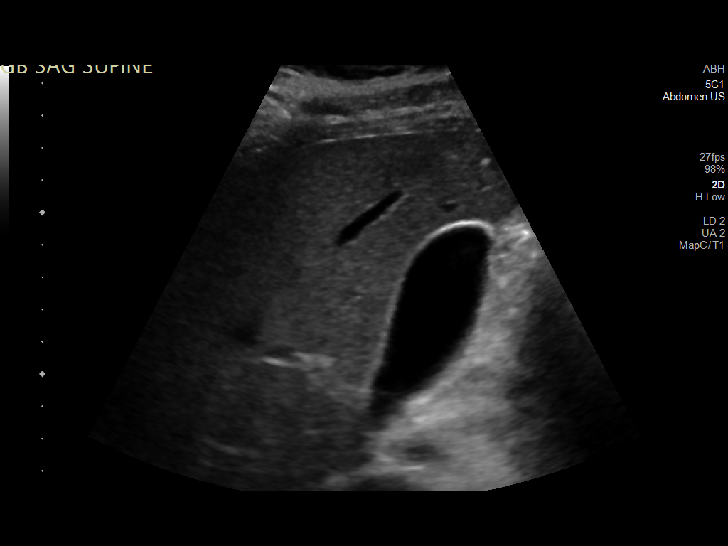
[im 6/65]
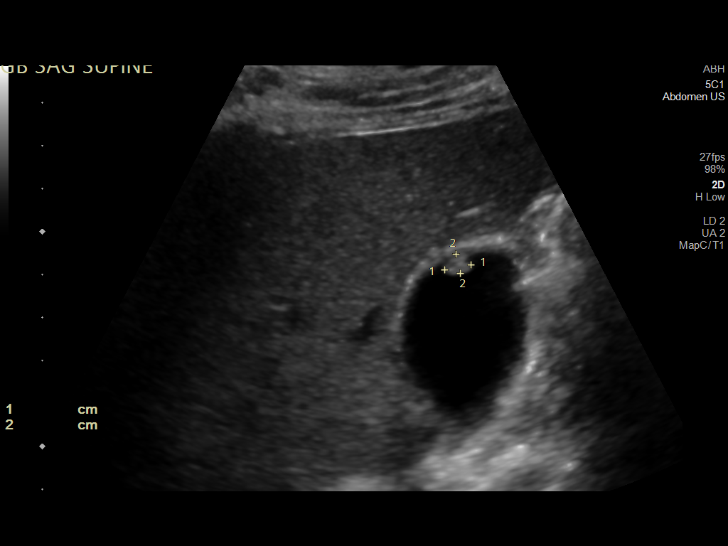
[im 11/65]
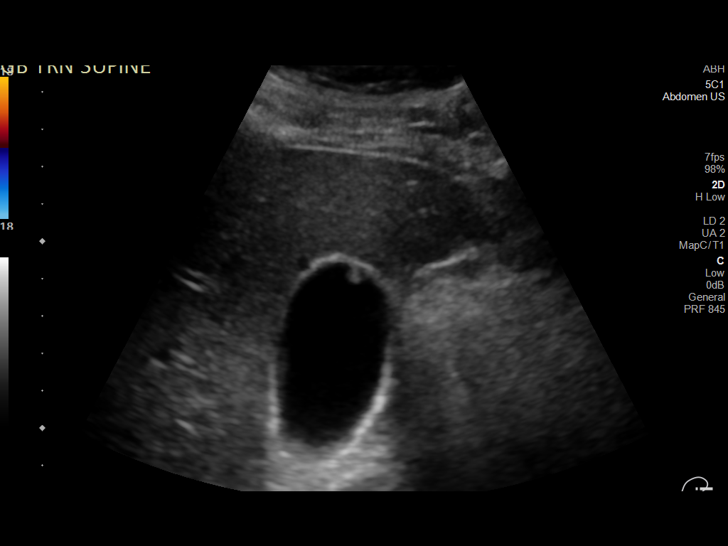
[im 17/65]
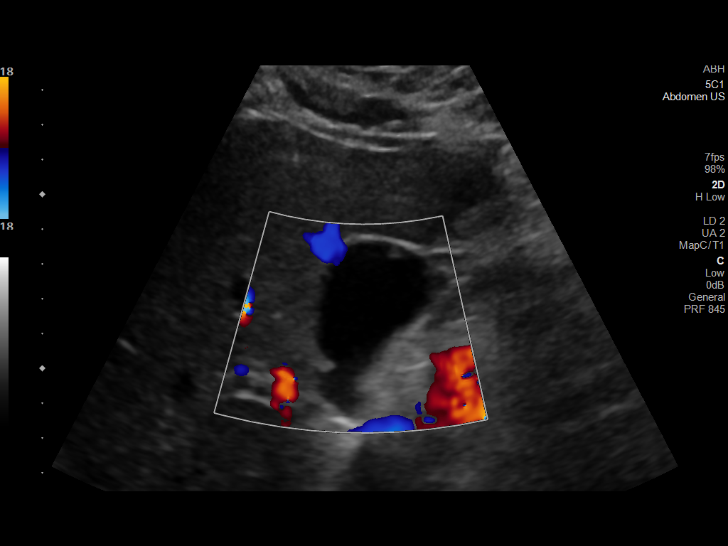
[im 22/65]
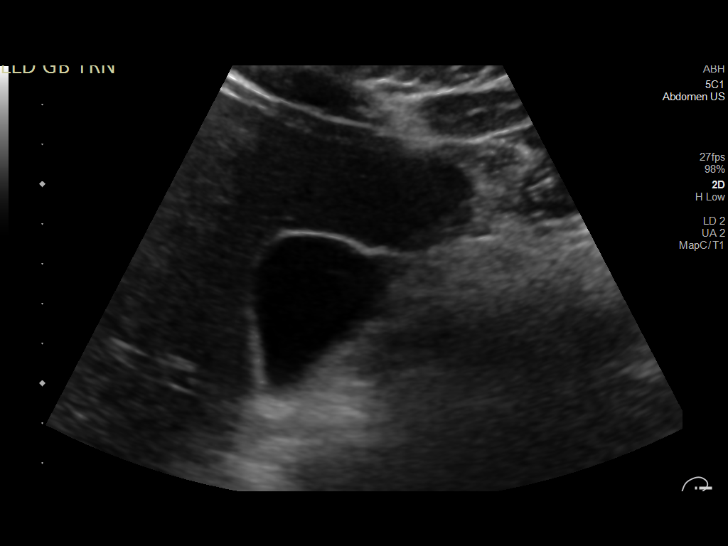
[im 25/65]
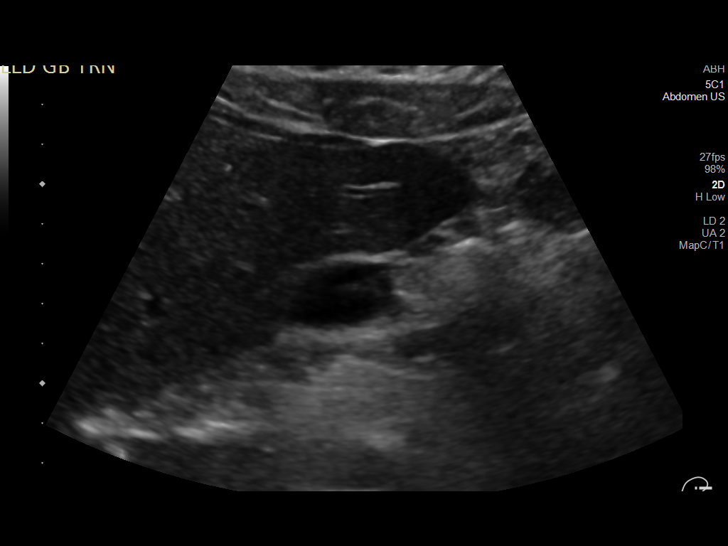
[im 30/65]
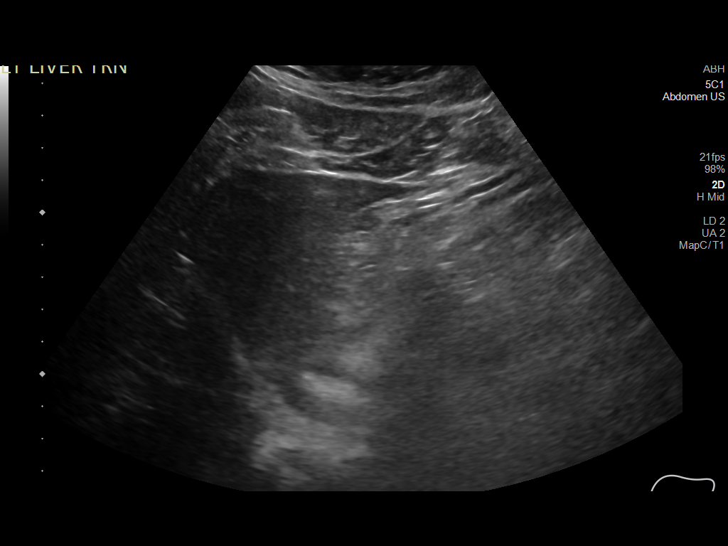
[im 35/65]
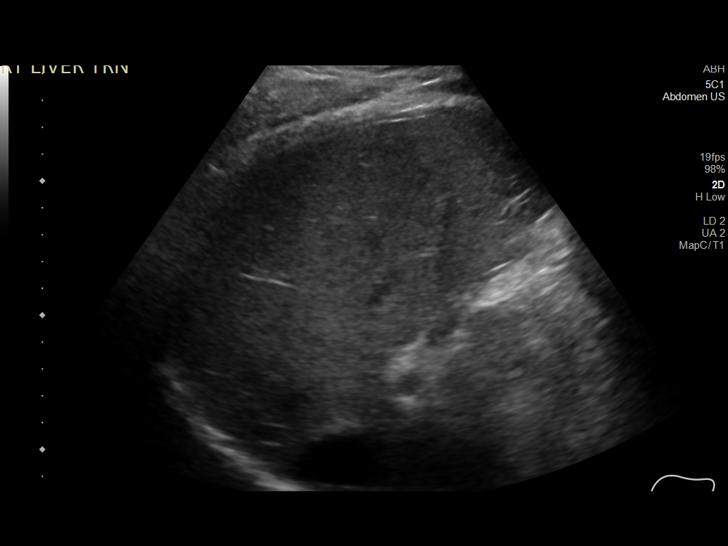
[im 41/65]
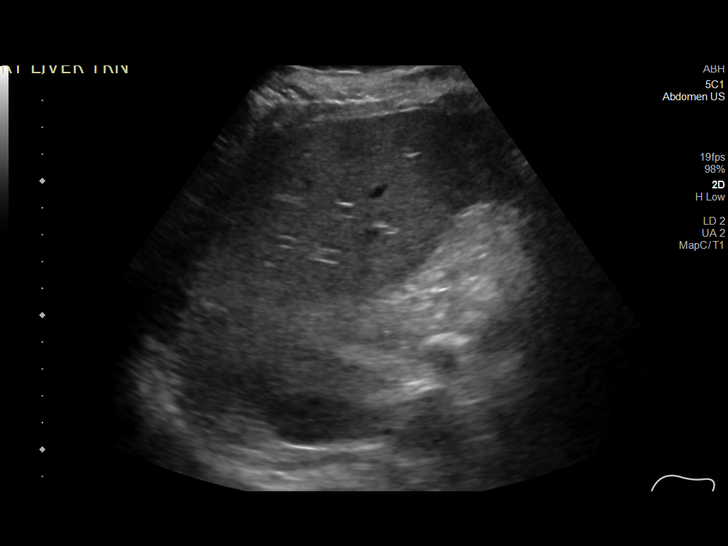
[im 43/65]
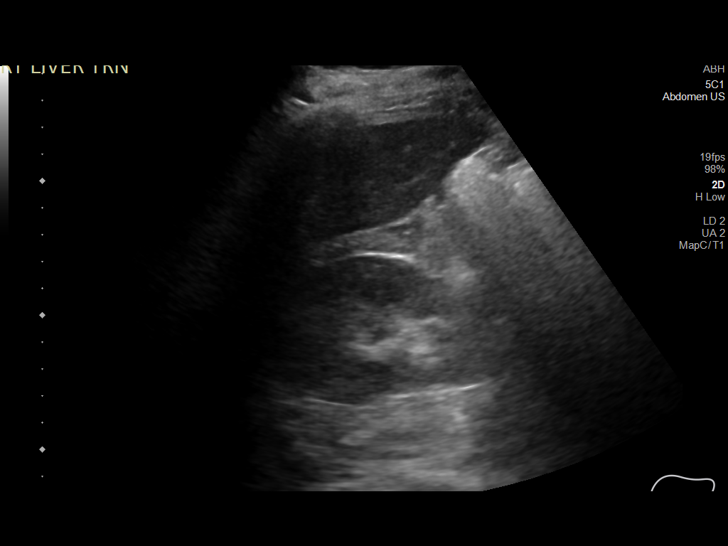
[im 49/65]
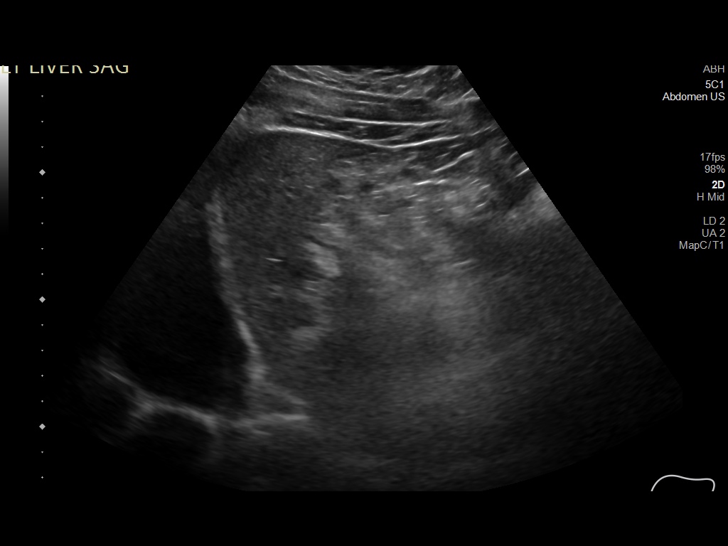
[im 54/65]
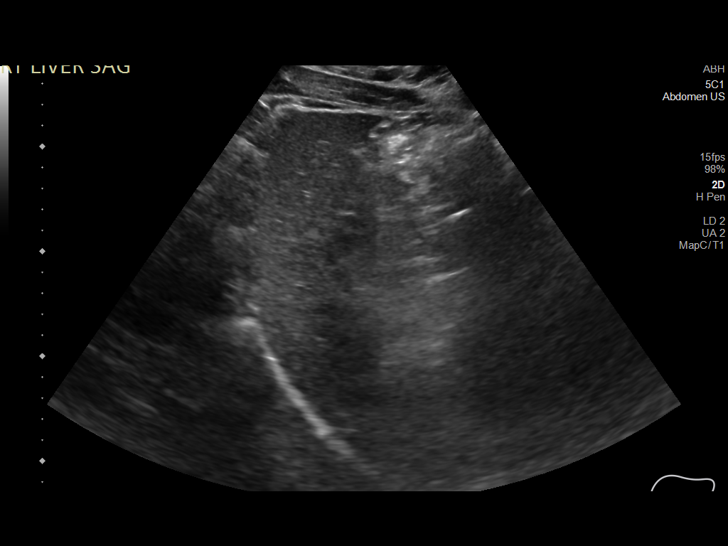
[im 59/65]
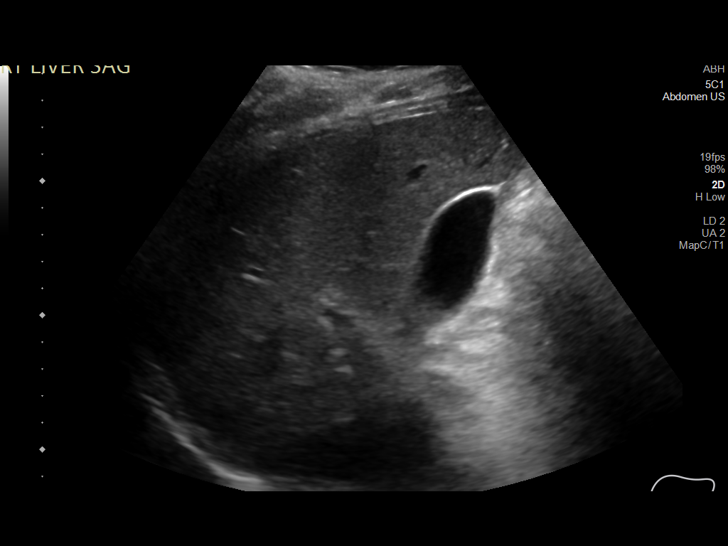
[im 65/65]
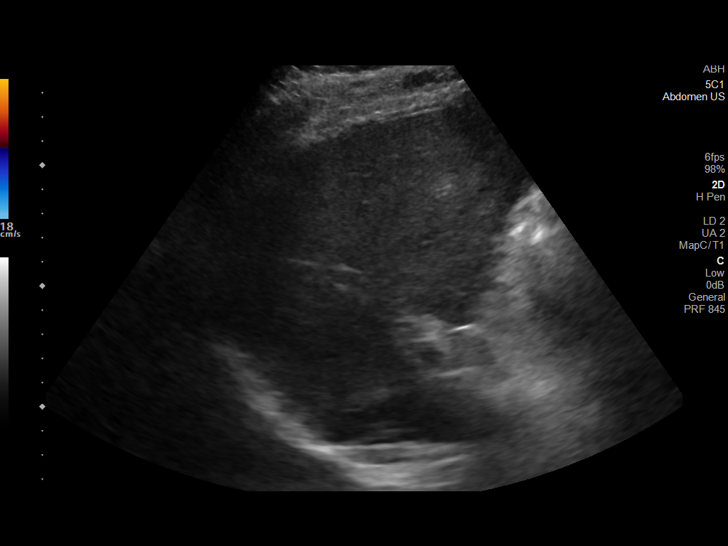

[14 of 25 positions shown; findings below may reference images not displayed]

FINDINGS: Gallbladder:

No gallstones or wall thickening visualized. There are two benign
small polyps measuring up to 0.6 cm. No sonographic Murphy sign
noted by sonographer.

Common bile duct:

Diameter: 0.3 cm, within normal limits

Liver:

No focal lesion identified. Within normal limits in parenchymal
echogenicity. Portal vein is patent on color Doppler imaging with
normal direction of blood flow towards the liver.

Other: None.
IMPRESSION: Benign small polyps in the gallbladder. Otherwise unremarkable
ultrasound exam of the right upper quadrant.

## 2022-06-26 ENCOUNTER — Encounter: Payer: Self-pay | Admitting: Internal Medicine

## 2022-07-24 ENCOUNTER — Telehealth: Payer: Self-pay

## 2022-07-24 DIAGNOSIS — K824 Cholesterolosis of gallbladder: Secondary | ICD-10-CM

## 2022-07-24 NOTE — Telephone Encounter (Signed)
[facility] at 10:00 am, to arr 9:30am . NPO 6 hours. (4:00am). Left detailed message for patient. MyChart message sent to patient with appointment info. Can call to reschedule if not convenient day or time: [phone]

## 2022-07-24 NOTE — Telephone Encounter (Signed)
-----   Message from Roetta Sessions, [place] sent at 08/06/2021  5:32 PM EDT ----- Regarding: U/S due in October Patient will be due for RUQ u/s to monitor gallbladder polyp in mid October (last one =08-05-21)

## 2022-07-29 ENCOUNTER — Telehealth: Payer: Self-pay | Admitting: Gastroenterology

## 2022-07-29 NOTE — Telephone Encounter (Signed)
Patient called states he would like to reschedule his ultrasound. Requesting a call back on  [phone]

## 2022-07-29 NOTE — Telephone Encounter (Signed)
Returned call to patient. I provided him with the phone number to radiology scheduling [phone]) so that he can reschedule [place] at his convenience. Pt verbalized understanding and had no concerns at the end of the call.

## 2022-07-30 ENCOUNTER — Encounter: Payer: Self-pay | Admitting: Internal Medicine

## 2022-08-05 ENCOUNTER — Ambulatory Visit (HOSPITAL_BASED_OUTPATIENT_CLINIC_OR_DEPARTMENT_OTHER): Payer: Managed Care, Other (non HMO)

## 2022-08-13 ENCOUNTER — Ambulatory Visit (HOSPITAL_BASED_OUTPATIENT_CLINIC_OR_DEPARTMENT_OTHER): Payer: BC Managed Care – PPO

## 2022-09-16 DIAGNOSIS — Z23 Encounter for immunization: Secondary | ICD-10-CM | POA: Diagnosis not present

## 2022-09-16 DIAGNOSIS — Z0000 Encounter for general adult medical examination without abnormal findings: Secondary | ICD-10-CM | POA: Diagnosis not present

## 2022-09-23 ENCOUNTER — Ambulatory Visit (HOSPITAL_COMMUNITY): Payer: BC Managed Care – PPO

## 2022-10-03 DIAGNOSIS — N529 Male erectile dysfunction, unspecified: Secondary | ICD-10-CM | POA: Diagnosis not present

## 2022-10-03 DIAGNOSIS — Z13220 Encounter for screening for lipoid disorders: Secondary | ICD-10-CM | POA: Diagnosis not present

## 2022-10-03 DIAGNOSIS — R6882 Decreased libido: Secondary | ICD-10-CM | POA: Diagnosis not present

## 2022-10-03 DIAGNOSIS — E559 Vitamin D deficiency, unspecified: Secondary | ICD-10-CM | POA: Diagnosis not present

## 2022-11-18 ENCOUNTER — Ambulatory Visit (HOSPITAL_COMMUNITY)
Admission: RE | Admit: 2022-11-18 | Discharge: 2022-11-18 | Disposition: A | Discharge status: Home / Self Care | Payer: BC Managed Care – PPO | Source: Ambulatory Visit | Attending: Gastroenterology | Admitting: Gastroenterology

## 2022-11-18 DIAGNOSIS — K824 Cholesterolosis of gallbladder: Secondary | ICD-10-CM | POA: Diagnosis not present

## 2023-01-24 DIAGNOSIS — K921 Melena: Secondary | ICD-10-CM | POA: Diagnosis not present

## 2023-02-14 DIAGNOSIS — K625 Hemorrhage of anus and rectum: Secondary | ICD-10-CM | POA: Diagnosis not present

## 2023-04-18 DIAGNOSIS — K625 Hemorrhage of anus and rectum: Secondary | ICD-10-CM | POA: Diagnosis not present

## 2023-09-26 DIAGNOSIS — E782 Mixed hyperlipidemia: Secondary | ICD-10-CM | POA: Diagnosis not present

## 2023-09-26 DIAGNOSIS — Z0000 Encounter for general adult medical examination without abnormal findings: Secondary | ICD-10-CM | POA: Diagnosis not present

## 2023-10-30 ENCOUNTER — Encounter: Payer: Self-pay | Admitting: Gastroenterology

## 2024-06-01 DIAGNOSIS — M545 Low back pain, unspecified: Secondary | ICD-10-CM | POA: Diagnosis not present

## 2024-09-11 DIAGNOSIS — F4321 Adjustment disorder with depressed mood: Secondary | ICD-10-CM | POA: Diagnosis not present

## 2024-09-25 DIAGNOSIS — F4321 Adjustment disorder with depressed mood: Secondary | ICD-10-CM | POA: Diagnosis not present
